# Patient Record
Sex: Male | Born: 1955 | Race: White | Hispanic: No | Marital: Married | Smoking: Former smoker
Health system: Southern US, Community
[De-identification: ages and names within clinical notes are randomized; demographics above are authoritative.]

## PROBLEM LIST (undated history)

## (undated) DIAGNOSIS — F1011 Alcohol abuse, in remission: Secondary | ICD-10-CM

## (undated) HISTORY — DX: Alcohol abuse, in remission: F10.11

---

## 2002-08-16 ENCOUNTER — Encounter: Payer: Self-pay | Admitting: Family Medicine

## 2002-08-16 ENCOUNTER — Encounter: Admission: RE | Admit: 2002-08-16 | Discharge: 2002-08-16 | Payer: Self-pay | Admitting: *Deleted

## 2006-03-08 HISTORY — PX: HERNIA REPAIR: SHX51

## 2006-06-06 ENCOUNTER — Encounter: Admission: RE | Admit: 2006-06-06 | Discharge: 2006-06-06 | Payer: Self-pay | Admitting: General Surgery

## 2006-06-08 ENCOUNTER — Ambulatory Visit (HOSPITAL_BASED_OUTPATIENT_CLINIC_OR_DEPARTMENT_OTHER): Admission: RE | Admit: 2006-06-08 | Discharge: 2006-06-08 | Payer: Self-pay | Admitting: General Surgery

## 2010-03-29 ENCOUNTER — Encounter: Payer: Self-pay | Admitting: Family Medicine

## 2012-06-09 ENCOUNTER — Encounter: Payer: Self-pay | Admitting: Internal Medicine

## 2012-06-09 ENCOUNTER — Ambulatory Visit (INDEPENDENT_AMBULATORY_CARE_PROVIDER_SITE_OTHER)
Admission: RE | Admit: 2012-06-09 | Discharge: 2012-06-09 | Disposition: A | Discharge status: Home / Self Care | Payer: BC Managed Care – PPO | Source: Ambulatory Visit | Attending: Internal Medicine | Admitting: Internal Medicine

## 2012-06-09 ENCOUNTER — Ambulatory Visit (INDEPENDENT_AMBULATORY_CARE_PROVIDER_SITE_OTHER): Payer: BC Managed Care – PPO | Admitting: Internal Medicine

## 2012-06-09 VITALS — BP 130/80 | HR 67 | Temp 97.8°F | Ht 70.0 in | Wt 174.0 lb

## 2012-06-09 DIAGNOSIS — R0609 Other forms of dyspnea: Secondary | ICD-10-CM

## 2012-06-09 DIAGNOSIS — R0602 Shortness of breath: Secondary | ICD-10-CM | POA: Insufficient documentation

## 2012-06-09 DIAGNOSIS — R0789 Other chest pain: Secondary | ICD-10-CM

## 2012-06-09 DIAGNOSIS — R0989 Other specified symptoms and signs involving the circulatory and respiratory systems: Secondary | ICD-10-CM

## 2012-06-09 LAB — CBC WITH DIFFERENTIAL/PLATELET
Basophils Absolute: 0 10*3/uL (ref 0.0–0.1)
Basophils Relative: 0.7 % (ref 0.0–3.0)
Eosinophils Relative: 1.4 % (ref 0.0–5.0)
Hemoglobin: 15.1 g/dL (ref 13.0–17.0)
Lymphocytes Relative: 21.9 % (ref 12.0–46.0)
Monocytes Relative: 7.6 % (ref 3.0–12.0)
Neutro Abs: 3.1 10*3/uL (ref 1.4–7.7)
RBC: 5.35 Mil/uL (ref 4.22–5.81)
RDW: 14.4 % (ref 11.5–14.6)
WBC: 4.5 10*3/uL (ref 4.5–10.5)

## 2012-06-09 LAB — COMPREHENSIVE METABOLIC PANEL
BUN: 12 mg/dL (ref 6–23)
CO2: 26 mEq/L (ref 19–32)
Calcium: 9.6 mg/dL (ref 8.4–10.5)
Chloride: 102 mEq/L (ref 96–112)
Creatinine, Ser: 1 mg/dL (ref 0.4–1.5)
GFR: 81.48 mL/min (ref >60.00)

## 2012-06-09 NOTE — Progress Notes (Signed)
Subjective:    Patient ID: Andres Day, male    DOB: 02-14-1956, 57 y.o.   MRN: [id number]  HPI New patient, has not seen a doctor in many years, has a number of symptoms, has a hard time describing them, to the best of my ability what he is experiencing is as follows:  When he gets anxious, gets numbness and a tingling feeling mostly in the left neck, left upper chest and left arm. Symptoms are also triggered by physical activity. He also feels dizzy. "Sometimes even the left side of my neck gets swollen " This is going on for approximately 3 years, frequently experiences palpitations when that happens but denies any syncope, diplopia or slurred speech. Symptoms decreased with rest but sometimes  "takes long time for them to go away".  Also, 2 months history of dyspnea on exertion, has been getting consistently short of breath when he go up the stairs activity that he used to do without problems. He is getting more tired recently. Despite that he remains very active in general.  Past Medical History  Diagnosis Date  . Alcohol abuse, in remission     quit 1995   Past Surgical History  Procedure Laterality Date  . Hernia repair Right 2008    inguinal   History   Social History  . Marital Status: Married    Spouse Name: N/A    Number of Children: 2  . Years of Education: N/A   Occupational History  . auction coordinator     Social History Main Topics  . Smoking status: Former Games developer  . Smokeless tobacco: Never Used     Comment: quit 1999, used to smoke 2.5 ppd  . Alcohol Use: No     Comment: former abuse   . Drug Use: No  . Sexually Active: Not on file   Other Topics Concern  . Not on file   Social History Narrative   Finished HS   Family History  Problem Relation Age of Onset  . Colon cancer Neg Hx   . Prostate cancer Neg Hx   . CAD Father 63  . Diabetes Father     many other family members  . Aneurysm Mother     type?    Review of Systems Denies  fever chills. No cough, weight loss. No claudication No nausea, vomiting, diarrhea or blood in the stools. No dysuria or difficulty urinating.     Objective:   Physical Exam BP 130/80  Pulse 67  Temp(Src) 97.8 F (36.6 C) (Oral)  Ht 5\' 10"  (1.778 m)  Wt 174 lb (78.926 kg)  BMI 24.97 kg/m2  SpO2 95%  General -- alert, well-developed, vital signs stable, BMI 24. .   Neck --no thyromegaly , normal carotid pulse, no carotid bruits HEENT -- not pale or jaundice  Lungs -- normal respiratory effort, no intercostal retractions, no accessory muscle use, and normal breath sounds.   Heart-- normal rate, regular rhythm, no murmur, and no gallop.   Abdomen--soft, non-tender, no distention, no masses, no HSM, no guarding, and no rigidity.  No bruit Extremities-- no pretibial edema bilaterally, normal femoral pulses  Neurologic-- alert & oriented X3 ; speech , gait normal; strength normal in all extremities. Psych-- Cognition and judgment appear intact. Alert and cooperative with normal attention span and concentration.  not anxious appearing and not depressed appearing.      Assessment & Plan:  Today , I spent more than 35 min with the patient, >50% of  the time counseling, and understanding his complex symptoms

## 2012-06-09 NOTE — Assessment & Plan Note (Addendum)
Patient gets some numbness in the left side of the neck, chest and left arm when he gets anxious and sometimes when he is physically active. Symptoms could be secondary to atypical presentation of angina. Cardiovascular risk factors include former tobacco abuse, father had heart attack at age 59. Lipids status unknown. Apparently no hypertension. EKG today showed sinus bradycardia. Plan: Labs Stress test Consider further eval (carotid u/s)

## 2012-06-09 NOTE — Assessment & Plan Note (Addendum)
2 months history of dyspnea on exertion with activities that he used to tolerate well, and despite that the patient remains active at work. He is a former smoker. Plan: 2 D Echo, r/o CHF Chest x-ray Labs Consider pfts

## 2012-06-09 NOTE — Patient Instructions (Addendum)
Please get your x-ray at the other [facility]  office located at: [address] [place], across from [facility].  Please go to the basement, this is a walk-in facility, they are open from 8:30 to 5:30 PM. Phone number [phone]. ----- If you have severe discomfort in the neck or chest please go to the emergency room. Will schedule echocardiogram stress test. Please come back in one month, fasting for a physical exam.

## 2012-06-12 NOTE — Addendum Note (Signed)
Addended by: Willow Ora E on: 06/12/2012 04:34 PM   Modules accepted: Orders

## 2012-06-13 ENCOUNTER — Encounter: Payer: Self-pay | Admitting: *Deleted

## 2012-06-16 ENCOUNTER — Ambulatory Visit (HOSPITAL_COMMUNITY): Payer: BC Managed Care – PPO | Attending: Cardiology

## 2012-06-16 DIAGNOSIS — R079 Chest pain, unspecified: Secondary | ICD-10-CM | POA: Insufficient documentation

## 2012-06-16 DIAGNOSIS — R0609 Other forms of dyspnea: Secondary | ICD-10-CM | POA: Insufficient documentation

## 2012-06-16 DIAGNOSIS — R072 Precordial pain: Secondary | ICD-10-CM

## 2012-06-16 DIAGNOSIS — R0989 Other specified symptoms and signs involving the circulatory and respiratory systems: Secondary | ICD-10-CM | POA: Insufficient documentation

## 2012-06-16 NOTE — Progress Notes (Signed)
Echocardiogram performed.  

## 2012-06-21 ENCOUNTER — Ambulatory Visit (HOSPITAL_COMMUNITY): Payer: BC Managed Care – PPO | Attending: Internal Medicine | Admitting: Radiology

## 2012-06-21 VITALS — BP 109/70 | Ht 70.0 in | Wt 172.0 lb

## 2012-06-21 DIAGNOSIS — R002 Palpitations: Secondary | ICD-10-CM | POA: Insufficient documentation

## 2012-06-21 DIAGNOSIS — R0609 Other forms of dyspnea: Secondary | ICD-10-CM | POA: Insufficient documentation

## 2012-06-21 DIAGNOSIS — R5381 Other malaise: Secondary | ICD-10-CM | POA: Insufficient documentation

## 2012-06-21 DIAGNOSIS — R0789 Other chest pain: Secondary | ICD-10-CM

## 2012-06-21 DIAGNOSIS — R42 Dizziness and giddiness: Secondary | ICD-10-CM | POA: Insufficient documentation

## 2012-06-21 DIAGNOSIS — Z87891 Personal history of nicotine dependence: Secondary | ICD-10-CM | POA: Insufficient documentation

## 2012-06-21 DIAGNOSIS — R079 Chest pain, unspecified: Secondary | ICD-10-CM | POA: Insufficient documentation

## 2012-06-21 DIAGNOSIS — Z8249 Family history of ischemic heart disease and other diseases of the circulatory system: Secondary | ICD-10-CM | POA: Insufficient documentation

## 2012-06-21 DIAGNOSIS — R0989 Other specified symptoms and signs involving the circulatory and respiratory systems: Secondary | ICD-10-CM | POA: Insufficient documentation

## 2012-06-21 MED ORDER — TECHNETIUM TC 99M SESTAMIBI GENERIC - CARDIOLITE
30.0000 | Freq: Once | INTRAVENOUS | Status: AC | PRN
  Administered 2012-06-21: 30 via INTRAVENOUS

## 2012-06-21 MED ORDER — TECHNETIUM TC 99M SESTAMIBI GENERIC - CARDIOLITE
10.0000 | Freq: Once | INTRAVENOUS | Status: AC | PRN
  Administered 2012-06-21: 10 via INTRAVENOUS

## 2012-06-21 NOTE — Progress Notes (Signed)
[facility] SITE 3 NUCLEAR MED [address] [place], [place] [address] [phone]    Cardiology Nuclear Med Study  Andres Day is a 57 y.o. male     MRN : [id number]     DOB: 1955-03-15  Procedure Date: 06/21/2012  Nuclear Med Background Indication for Stress Test:  Evaluation for Ischemia History:  06/16/2012:  ECHO No prior Cardiac Hx Cardiac Risk Factors: Family History - CAD and History of Smoking  Symptoms:  Chest Pain, Dizziness, DOE, Fatigue, Fatigue with Exertion, Light-Headedness and Palpitations   Nuclear Pre-Procedure Caffeine/Decaff Intake:  None > 12 hrs NPO After: 9:00pm   Lungs:  clear O2 Sat: 97% on room air. IV 0.9% NS with Angio Cath:  20g  IV Site: R Antecubital x 1, tolerated well IV Started by:  Irean Hong, RN  Chest Size (in):  42 Cup Size: n/a  Height: 5\' 10"  (1.778 m)  Weight:  172 lb (78.019 kg)  BMI:  Body mass index is 24.68 kg/(m^2). Tech Comments:  n/a    Nuclear Med Study 1 or 2 day study: 1 day  Stress Test Type:  Stress  Reading MD: Kristeen Miss, MD  Order Authorizing Provider:  Willow Ora, MD  Resting Radionuclide: Technetium 73m Sestamibi  Resting Radionuclide Dose: 9.4 mCi   Stress Radionuclide:  Technetium 93m Sestamibi  Stress Radionuclide Dose: 30.1 mCi           Stress Protocol Rest HR: 57 Stress HR: 151  Rest BP: 109/70 Stress BP: 173/69  Exercise Time (min): 10:00 METS: 11.70   Predicted Max HR: 162 bpm % Max HR: 93.21 bpm Rate Pressure Product: 52841   Dose of Adenosine (mg):  n/a Dose of Lexiscan: n/a mg  Dose of Atropine (mg): n/a Dose of Dobutamine: n/a mcg/kg/min (at max HR)  Stress Test Technologist: Milana Na, EMT-P  Nuclear Technologist:  Domenic Polite, CNMT     Rest Procedure:  Myocardial perfusion imaging was performed at rest 45 minutes following the intravenous administration of Technetium 36m Sestamibi. Rest ECG: NSR - Normal EKG  Stress Procedure:  The patient exercised on the  treadmill utilizing the Bruce Protocol for 10:00 minutes. The patient stopped due to (L) arm/ (L) neck throbbing, and denied any chest pain.  Technetium 12m Sestamibi was injected at peak exercise and myocardial perfusion imaging was performed after a brief delay. Stress ECG: No significant change from baseline ECG  QPS Raw Data Images:  Mild diaphragmatic attenuation.  Normal left ventricular size. Stress Images:   There is a small area of mildly decreased uptake in the inferior basal segments.   Rest Images:   There is a small area of mildly decreased uptake in the inferior basal segments. Subtraction (SDS):  No evidence of ischemia. Transient Ischemic Dilatation (Normal <1.22):  0.92 Lung/Heart Ratio (Normal <0.45):  0.29  Quantitative Gated Spect Images QGS EDV:  99 ml QGS ESV:  40 ml  Impression Exercise Capacity:  Good exercise capacity. BP Response:  Normal blood pressure response. Clinical Symptoms:  No significant symptoms noted. ECG Impression:  No significant ST segment change suggestive of ischemia. Comparison with Prior Nuclear Study: No images to compare  Overall Impression:  Low risk stress nuclear study.  No evidence of ischemia.  The inferior basal attenuation is likely due to diaphragmatic attenuation.   LV Ejection Fraction: 60%.  LV Wall Motion:  NL LV Function; NL Wall Motion.   Vesta Mixer, Montez Hageman., MD, [facility] 06/21/2012, 5:49 PM Office -  [phone] Pager [phone]

## 2012-07-10 ENCOUNTER — Ambulatory Visit (INDEPENDENT_AMBULATORY_CARE_PROVIDER_SITE_OTHER): Payer: BC Managed Care – PPO | Admitting: Internal Medicine

## 2012-07-10 ENCOUNTER — Encounter: Payer: Self-pay | Admitting: Internal Medicine

## 2012-07-10 VITALS — BP 122/74 | HR 72 | Wt 174.0 lb

## 2012-07-10 DIAGNOSIS — Z0000 Encounter for general adult medical examination without abnormal findings: Secondary | ICD-10-CM | POA: Insufficient documentation

## 2012-07-10 DIAGNOSIS — R0609 Other forms of dyspnea: Secondary | ICD-10-CM

## 2012-07-10 DIAGNOSIS — J309 Allergic rhinitis, unspecified: Secondary | ICD-10-CM

## 2012-07-10 DIAGNOSIS — R0789 Other chest pain: Secondary | ICD-10-CM

## 2012-07-10 LAB — LIPID PANEL
HDL: 53.8 mg/dL (ref >39.00)
Total CHOL/HDL Ratio: 4
VLDL: 9.2 mg/dL (ref 0.0–40.0)

## 2012-07-10 MED ORDER — FLUTICASONE PROPIONATE 50 MCG/ACT NA SUSP: 2.0000 | Freq: Every day | NASAL | Status: DC

## 2012-07-10 NOTE — Assessment & Plan Note (Signed)
Chest x-ray, echocardiogram, stress this were negative. Symptoms are subjectively decreased. Plan is observation

## 2012-07-10 NOTE — Progress Notes (Signed)
  Subjective:    Patient ID: Andres Day, male    DOB: [birth year], 57 y.o.   MRN: [id number]  HPI F/U from previous visit  since the last time he was here, CP-DOE workup was negative. Results reviewed with patient.  Past Medical History  Diagnosis Date  . Alcohol abuse, in remission     quit 1995   Past Surgical History  Procedure Laterality Date  . Hernia repair Right 2008    inguinal     Review of Systems Still has some occasional discomfort at the left neck as previously described and mild DOE however subjectively symptoms are less noticeable. Denies wheezing or coughing with exertion. Today did reports a chronic nasal congestion and postnasal dripping year round. Admits to some stress but not  anxiety perse.    Objective:   Physical Exam General -- alert, well-developed,No apparent distress    HEENT --  face symmetric and nose is slightly congested  Lungs -- normal respiratory effort, no intercostal retractions, no accessory muscle use, and normal breath sounds.   Heart-- normal rate, regular rhythm, no murmur, and no gallop.    Neurologic-- alert & oriented X3 and strength normal in all extremities. Psych-- Cognition and judgment appear intact. Alert and cooperative with normal attention span and concentration.  not anxious appearing and not depressed appearing.       Assessment & Plan:

## 2012-07-10 NOTE — Assessment & Plan Note (Signed)
Although we are not doing any physical exam today, we talked about colon cancer screening.He's not ready for a colonoscopy agreed to do a iFOB. Also will check a cholesterol panel.

## 2012-07-10 NOTE — Assessment & Plan Note (Signed)
Year round symptoms, rx flonase

## 2012-07-10 NOTE — Assessment & Plan Note (Signed)
Denies cough or wheezing with exertion, echocardiogram essentially normal. Chest x-ray normal. Deconditioning? Recommend a gradual exercise program

## 2012-07-10 NOTE — Patient Instructions (Addendum)
Start a exercise program (heart rate ~ 130), gradually try to exercise 30 minutes a day Next visit 6-8  months, fasting for a physical

## 2013-01-11 ENCOUNTER — Other Ambulatory Visit: Payer: Self-pay

## 2019-03-22 DIAGNOSIS — H5203 Hypermetropia, bilateral: Secondary | ICD-10-CM | POA: Diagnosis not present

## 2019-03-22 DIAGNOSIS — H2513 Age-related nuclear cataract, bilateral: Secondary | ICD-10-CM | POA: Diagnosis not present

## 2021-04-08 DIAGNOSIS — H2513 Age-related nuclear cataract, bilateral: Secondary | ICD-10-CM | POA: Diagnosis not present

## 2021-04-08 DIAGNOSIS — H5203 Hypermetropia, bilateral: Secondary | ICD-10-CM | POA: Diagnosis not present

## 2021-04-15 ENCOUNTER — Other Ambulatory Visit: Payer: Self-pay

## 2021-04-15 ENCOUNTER — Ambulatory Visit (INDEPENDENT_AMBULATORY_CARE_PROVIDER_SITE_OTHER): Payer: Medicare Other | Admitting: Nurse Practitioner

## 2021-04-15 ENCOUNTER — Encounter: Payer: Self-pay | Admitting: Nurse Practitioner

## 2021-04-15 VITALS — BP 163/65 | HR 64 | Temp 98.0°F | Ht 70.0 in | Wt 195.0 lb

## 2021-04-15 DIAGNOSIS — L408 Other psoriasis: Secondary | ICD-10-CM | POA: Insufficient documentation

## 2021-04-15 DIAGNOSIS — I6529 Occlusion and stenosis of unspecified carotid artery: Secondary | ICD-10-CM | POA: Insufficient documentation

## 2021-04-15 DIAGNOSIS — Z6825 Body mass index (BMI) 25.0-25.9, adult: Secondary | ICD-10-CM | POA: Insufficient documentation

## 2021-04-15 DIAGNOSIS — Z6827 Body mass index (BMI) 27.0-27.9, adult: Secondary | ICD-10-CM

## 2021-04-15 DIAGNOSIS — R0789 Other chest pain: Secondary | ICD-10-CM

## 2021-04-15 DIAGNOSIS — Z7689 Persons encountering health services in other specified circumstances: Secondary | ICD-10-CM

## 2021-04-15 DIAGNOSIS — I779 Disorder of arteries and arterioles, unspecified: Secondary | ICD-10-CM | POA: Insufficient documentation

## 2021-04-15 DIAGNOSIS — R0602 Shortness of breath: Secondary | ICD-10-CM | POA: Diagnosis not present

## 2021-04-15 DIAGNOSIS — Z87891 Personal history of nicotine dependence: Secondary | ICD-10-CM | POA: Insufficient documentation

## 2021-04-15 DIAGNOSIS — I10 Essential (primary) hypertension: Secondary | ICD-10-CM

## 2021-04-15 MED ORDER — TRIAMCINOLONE ACETONIDE 0.025 % EX CREA: 1.0000 "application " | TOPICAL_CREAM | Freq: Two times a day (BID) | CUTANEOUS | 2 refills | Status: AC

## 2021-04-15 MED ORDER — LISINOPRIL 5 MG PO TABS: 5.0000 mg | ORAL_TABLET | Freq: Every day | ORAL | 3 refills | Status: DC

## 2021-04-15 NOTE — Progress Notes (Signed)
New Patient Office Visit  Subjective:  Patient ID: Andres Day, male    DOB: Dec 27, 1955  Age: 66 y.o. MRN: [id number]  CC:  Chief Complaint  Patient presents with   New Patient (Initial Visit)    HPI MUADH CREASY presents to establish new primary care provider. States that he has not had a primary provider, or any provider, for several years. He states that he has shortness of breath. This is much worse with exertion. He states that he starts feeling short of breath with chest pain after 100 yards. He states that if he keeps going, it does get worse. Pain will radiate down the left arm and into the left side of the neck. If he slows down and rests, this immediately improves.  States that sometimes, at night, while laying down, he feels some swelling in his left chest. He did have some similar complaints the last time he saw a provider, which was 2014. He did have ECG done in 2014. This sowed sinus bradycardia and was otherwise normal. His echo showed mild LVH, and chest x-ray was normal.  -has numbness in the hands, mostly in the thumb and first two fingers. Does a lot of Dealer work. Uses his hands often. Believes he may have some arthritis.  -chronic psoriasis    Past Medical History:  Diagnosis Date   Alcohol abuse, in remission    quit 1995    Past Surgical History:  Procedure Laterality Date   HERNIA REPAIR Right 2008   inguinal    Family History  Problem Relation Age of Onset   Colon cancer Neg Hx    Prostate cancer Neg Hx    CAD Father 84   Diabetes Father        many other family members   Aneurysm Mother        type?    Social History   Socioeconomic History   Marital status: Married    Spouse name: Not on file   Number of children: 2   Years of education: Not on file   Highest education level: Not on file  Occupational History   Occupation: Chief Executive Officer   Tobacco Use   Smoking status: Former   Smokeless tobacco: Never   Tobacco  comments:    quit 1999, used to smoke 2.5 ppd  Substance and Sexual Activity   Alcohol use: No    Comment: former abuse    Drug use: No   Sexual activity: Not Currently  Other Topics Concern   Not on file  Social History Narrative   Finished HS   Social Determinants of Health   Financial Resource Strain: Not on file  Food Insecurity: Not on file  Transportation Needs: Not on file  Physical Activity: Not on file  Stress: Not on file  Social Connections: Not on file  Intimate Partner Violence: Not on file    ROS Review of Systems  Constitutional:  Positive for fatigue. Negative for activity change, chills and fever.  HENT:  Negative for congestion, postnasal drip, rhinorrhea, sinus pressure, sinus pain, sneezing and sore throat.   Eyes: Negative.   Respiratory:  Positive for chest tightness, shortness of breath and wheezing. Negative for cough.   Cardiovascular:  Positive for chest pain. Negative for palpitations.  Gastrointestinal:  Negative for constipation, diarrhea, nausea and vomiting.  Endocrine: Negative for cold intolerance, heat intolerance, polydipsia and polyuria.  Genitourinary:  Negative for dysuria, frequency and urgency.  Musculoskeletal:  Positive for arthralgias  and joint swelling. Negative for back pain and myalgias.       Pain and swelling in the joints of the fingers and hands. Has tingling in the hands and fingers. This is worse on the right side than left.   Skin:  Negative for rash.  Allergic/Immunologic: Negative for environmental allergies.  Neurological:  Negative for dizziness, weakness and headaches.  Psychiatric/Behavioral:  The patient is not nervous/anxious.    Objective:   Today's Vitals   04/15/21 0957  BP: (!) 163/65  Pulse: 64  Temp: 98 F (36.7 C)  SpO2: 98%  Weight: 195 lb 0.4 oz (88.5 kg)  Height: 5\' 10"  (1.778 m)   Body mass index is 27.98 kg/m.   Physical Exam Vitals and nursing note reviewed.  Constitutional:       Appearance: Normal appearance. He is well-developed.  HENT:     Head: Normocephalic and atraumatic.  Eyes:     Pupils: Pupils are equal, round, and reactive to light.  Neck:     Vascular: No carotid bruit.  Cardiovascular:     Rate and Rhythm: Normal rate and regular rhythm.     Pulses: Normal pulses.     Heart sounds: Normal heart sounds.     Comments: ECG done today is within normal limits  Pulmonary:     Effort: Pulmonary effort is normal.     Breath sounds: Normal breath sounds.  Abdominal:     Palpations: Abdomen is soft.  Musculoskeletal:        General: Normal range of motion.     Cervical back: Normal range of motion and neck supple.  Lymphadenopathy:     Cervical: No cervical adenopathy.  Skin:    General: Skin is warm and dry.     Capillary Refill: Capillary refill takes less than 2 seconds.  Neurological:     General: No focal deficit present.     Mental Status: He is alert and oriented to person, place, and time.  Psychiatric:        Mood and Affect: Mood normal.        Behavior: Behavior normal.        Thought Content: Thought content normal.        Judgment: Judgment normal.    Assessment & Plan:  1. Encounter to establish care Appointment today to establish new primary care provider    2. Atypical chest pain ECG is normal during today's visit.   3. Shortness of breath Normal ECG today. Will get CT chest, lung cancer screening for further evaluation. He does have history of smoking for 29 years. Has quit since [place] LOW DOSE WO CONTRAST; Future - EKG 12-Lead  4. Essential hypertension Start lisinopril 5mg  daily. Recommended decreasing salt and increasing water in the diet. Patient is physically active.  - lisinopril (ZESTRIL) 5 MG tablet; Take 1 tablet (5 mg total) by mouth daily.  Dispense: 90 tablet; Refill: 3  5. Stenosis of carotid artery, unspecified laterality Will get carotid doppler study for further  evaluation - US Carotid Bilateral; Future  6. History of smoking 25-50 pack years Patient was heavy smoker for 29 years. Quit smoking in [place]; Future  7. Body mass index 27.0-27.9, adult Encourage patient to limit calorie intake to 2000 cal/day or less.  He should consume a low cholesterol, low-fat diet.    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 Apply to effected areas twice daily as needed  - triamcinolone (KENALOG) 0.025 % cream; Apply 1 application topically 2 (two) times daily.  Dispense: 80 g; Refill: 2    Problem List Items Addressed This Visit       Cardiovascular and Mediastinum   Essential hypertension   Relevant Medications   lisinopril (ZESTRIL) 5 MG tablet   Stenosis of carotid artery   Relevant Medications   lisinopril (ZESTRIL) 5 MG tablet   Other Relevant Orders   US Carotid Bilateral     Musculoskeletal and Integument   Psoriasis annularis   Relevant Medications   triamcinolone (KENALOG) 0.025 % cream     Other   Shortness of breath   Relevant Orders   CT CHEST LUNG CANCER SCREENING LOW DOSE WO CONTRAST   EKG 12-Lead (Completed)   Atypical chest pain   History of smoking 25-50 pack years   Relevant Orders   CT CHEST LUNG CANCER SCREENING LOW DOSE WO CONTRAST   Body mass index 27.0-27.9, adult   Other Visit Diagnoses     Encounter to establish care    -  Primary       Outpatient Encounter Medications as of 04/15/2021  Medication Sig   lisinopril (ZESTRIL) 5 MG tablet Take 1 tablet (5 mg total) by mouth daily.   triamcinolone (KENALOG) 0.025 % cream Apply 1 application topically 2 (two) times daily.   aspirin 81 MG tablet Take 81 mg by mouth daily.   fluticasone (FLONASE) 50 MCG/ACT nasal spray Place 2 sprays into the nose daily.   No facility-administered encounter medications on file as of 04/15/2021.    Follow-up: Return in about 2 weeks (around 04/29/2021)  for medicare wellness, FBW a week prior to visit. add psa, CK, CKMB, Free T4.   Ronnell Freshwater, NP

## 2021-04-21 ENCOUNTER — Ambulatory Visit
Admission: RE | Admit: 2021-04-21 | Discharge: 2021-04-21 | Disposition: A | Discharge status: Home / Self Care | Payer: Medicare Other | Source: Ambulatory Visit | Attending: Nurse Practitioner | Admitting: Nurse Practitioner

## 2021-04-21 ENCOUNTER — Other Ambulatory Visit: Payer: Self-pay

## 2021-04-21 DIAGNOSIS — E039 Hypothyroidism, unspecified: Secondary | ICD-10-CM

## 2021-04-21 DIAGNOSIS — Z1321 Encounter for screening for nutritional disorder: Secondary | ICD-10-CM

## 2021-04-21 DIAGNOSIS — I6529 Occlusion and stenosis of unspecified carotid artery: Secondary | ICD-10-CM

## 2021-04-21 DIAGNOSIS — Z130 Encounter for screening for diseases of the blood and blood-forming organs and certain disorders involving the immune mechanism: Secondary | ICD-10-CM

## 2021-04-21 DIAGNOSIS — I6523 Occlusion and stenosis of bilateral carotid arteries: Secondary | ICD-10-CM | POA: Diagnosis not present

## 2021-04-21 DIAGNOSIS — Z0000 Encounter for general adult medical examination without abnormal findings: Secondary | ICD-10-CM

## 2021-04-21 DIAGNOSIS — Z125 Encounter for screening for malignant neoplasm of prostate: Secondary | ICD-10-CM

## 2021-04-21 DIAGNOSIS — I779 Disorder of arteries and arterioles, unspecified: Secondary | ICD-10-CM

## 2021-04-21 NOTE — Progress Notes (Signed)
Plaque present with no significant stenosis. Review with patient at visit 04/30/2021

## 2021-04-23 ENCOUNTER — Other Ambulatory Visit: Payer: Medicare Other

## 2021-04-23 ENCOUNTER — Other Ambulatory Visit: Payer: Self-pay

## 2021-04-23 DIAGNOSIS — Z125 Encounter for screening for malignant neoplasm of prostate: Secondary | ICD-10-CM

## 2021-04-23 DIAGNOSIS — Z0000 Encounter for general adult medical examination without abnormal findings: Secondary | ICD-10-CM

## 2021-04-23 DIAGNOSIS — I779 Disorder of arteries and arterioles, unspecified: Secondary | ICD-10-CM

## 2021-04-23 DIAGNOSIS — Z13228 Encounter for screening for other metabolic disorders: Secondary | ICD-10-CM

## 2021-04-23 DIAGNOSIS — Z1321 Encounter for screening for nutritional disorder: Secondary | ICD-10-CM | POA: Diagnosis not present

## 2021-04-23 DIAGNOSIS — E039 Hypothyroidism, unspecified: Secondary | ICD-10-CM

## 2021-04-23 DIAGNOSIS — Z1329 Encounter for screening for other suspected endocrine disorder: Secondary | ICD-10-CM | POA: Diagnosis not present

## 2021-04-24 LAB — CBC
Hematocrit: 45.3 % (ref 37.5–51.0)
Hemoglobin: 15.2 g/dL (ref 13.0–17.7)
MCH: 28.4 pg (ref 26.6–33.0)
MCHC: 33.6 g/dL (ref 31.5–35.7)
MCV: 85 fL (ref 79–97)
Platelets: 233 10*3/uL (ref 150–450)
RBC: 5.35 x10E6/uL (ref 4.14–5.80)
RDW: 13.5 % (ref 11.6–15.4)
WBC: 5.6 10*3/uL (ref 3.4–10.8)

## 2021-04-24 LAB — COMPREHENSIVE METABOLIC PANEL
ALT: 10 IU/L (ref 0–44)
AST: 16 IU/L (ref 0–40)
Albumin/Globulin Ratio: 1.6 (ref 1.2–2.2)
Albumin: 4.4 g/dL (ref 3.8–4.8)
Alkaline Phosphatase: 79 IU/L (ref 44–121)
BUN/Creatinine Ratio: 12 (ref 10–24)
BUN: 15 mg/dL (ref 8–27)
Bilirubin Total: 0.6 mg/dL (ref 0.0–1.2)
CO2: 24 mmol/L (ref 20–29)
Calcium: 9.5 mg/dL (ref 8.6–10.2)
Chloride: 103 mmol/L (ref 96–106)
Creatinine, Ser: 1.25 mg/dL (ref 0.76–1.27)
Globulin, Total: 2.7 g/dL (ref 1.5–4.5)
Glucose: 102 mg/dL — ABNORMAL HIGH (ref 70–99)
Potassium: 4.8 mmol/L (ref 3.5–5.2)
Sodium: 141 mmol/L (ref 134–144)
Total Protein: 7.1 g/dL (ref 6.0–8.5)
eGFR: 63 mL/min/{1.73_m2} (ref >59)

## 2021-04-24 LAB — T4, FREE: Free T4: 0.93 ng/dL (ref 0.82–1.77)

## 2021-04-24 LAB — HEMOGLOBIN A1C
Est. average glucose Bld gHb Est-mCnc: 126 mg/dL
Hgb A1c MFr Bld: 6 % — ABNORMAL HIGH (ref 4.8–5.6)

## 2021-04-24 LAB — CK TOTAL AND CKMB (NOT AT ARMC)
CK-MB Index: 1.7 ng/mL (ref 0.0–10.4)
Total CK: 112 U/L (ref 41–331)

## 2021-04-24 LAB — LIPID PANEL
Chol/HDL Ratio: 3.8 ratio (ref 0.0–5.0)
Cholesterol, Total: 193 mg/dL (ref 100–199)
HDL: 51 mg/dL (ref >39)
LDL Chol Calc (NIH): 128 mg/dL — ABNORMAL HIGH (ref 0–99)
Triglycerides: 75 mg/dL (ref 0–149)
VLDL Cholesterol Cal: 14 mg/dL (ref 5–40)

## 2021-04-24 LAB — TSH: TSH: 5.7 u[IU]/mL — ABNORMAL HIGH (ref 0.450–4.500)

## 2021-04-24 LAB — PSA: Prostate Specific Ag, Serum: 4 ng/mL (ref 0.0–4.0)

## 2021-04-24 NOTE — Progress Notes (Signed)
Consider additions of levothyroxine 40mcg daily. Discuss at visit 04/30/2021

## 2021-04-30 ENCOUNTER — Other Ambulatory Visit: Payer: Self-pay

## 2021-04-30 ENCOUNTER — Ambulatory Visit (INDEPENDENT_AMBULATORY_CARE_PROVIDER_SITE_OTHER): Payer: Medicare Other | Admitting: Nurse Practitioner

## 2021-04-30 ENCOUNTER — Encounter: Payer: Self-pay | Admitting: Nurse Practitioner

## 2021-04-30 VITALS — BP 114/66 | HR 64 | Temp 97.8°F | Ht 70.0 in | Wt 192.8 lb

## 2021-04-30 DIAGNOSIS — R0602 Shortness of breath: Secondary | ICD-10-CM

## 2021-04-30 DIAGNOSIS — D485 Neoplasm of uncertain behavior of skin: Secondary | ICD-10-CM | POA: Diagnosis not present

## 2021-04-30 DIAGNOSIS — I779 Disorder of arteries and arterioles, unspecified: Secondary | ICD-10-CM

## 2021-04-30 DIAGNOSIS — Z0000 Encounter for general adult medical examination without abnormal findings: Secondary | ICD-10-CM

## 2021-04-30 DIAGNOSIS — I10 Essential (primary) hypertension: Secondary | ICD-10-CM

## 2021-04-30 NOTE — Progress Notes (Signed)
Subjective:   Andres Day is a 66 y.o. male who presents for Medicare Annual/Subsequent preventive examination.  -improved chest discomfort and shortness of breath. Normal ck and CKMB on labs.  -better control of blood pressure -mild elevation of LDL -review of carotid doppler - mild plaque formation in bilateral carotids. No significant stenosis present -several dark and rough colored moles on his back which are bothersome. Would like to have referral to dermatology for further evaluation.   Review of Systems    Review of Systems  Constitutional:  Negative for fever, malaise/fatigue and weight loss.  HENT:  Negative for congestion, ear discharge, ear pain, hearing loss and sore throat.   Eyes: Negative.   Respiratory:  Negative for cough, shortness of breath and wheezing.   Cardiovascular:  Positive for orthopnea. Negative for chest pain.  Gastrointestinal:  Negative for abdominal pain, blood in stool, constipation, diarrhea, nausea and vomiting.  Genitourinary:  Negative for dysuria, flank pain, frequency and urgency.  Musculoskeletal:  Negative for back pain and myalgias.  Skin:  Negative for itching and rash.       Few moles on upper back he would like to have evaluated per dermatology   Neurological:  Negative for dizziness, weakness and headaches.  Psychiatric/Behavioral:  Negative for depression.          Objective:    Today's Vitals   04/30/21 1052  BP: 114/66  Pulse: 64  Temp: 97.8 F (36.6 C)  SpO2: 98%  Weight: 192 lb 12.8 oz (87.5 kg)  Height: 5\' 10"  (1.778 m)   Body mass index is 27.66 kg/m.  Physical Exam Vitals and nursing note reviewed.  Constitutional:      Appearance: Normal appearance. He is well-developed.  HENT:     Head: Normocephalic and atraumatic.     Right Ear: Tympanic membrane, ear canal and external ear normal.     Left Ear: Tympanic membrane, ear canal and external ear normal.     Nose: Nose normal.     Mouth/Throat:     Mouth:  Mucous membranes are moist.     Pharynx: Oropharynx is clear.  Eyes:     Extraocular Movements: Extraocular movements intact.     Conjunctiva/sclera: Conjunctivae normal.     Pupils: Pupils are equal, round, and reactive to light.  Neck:     Vascular: No carotid bruit.  Cardiovascular:     Rate and Rhythm: Normal rate and regular rhythm.     Pulses: Normal pulses.     Heart sounds: Normal heart sounds.  Pulmonary:     Effort: Pulmonary effort is normal.     Breath sounds: Normal breath sounds.  Abdominal:     General: Bowel sounds are normal. There is no distension.     Palpations: Abdomen is soft. There is no mass.     Tenderness: There is no abdominal tenderness. There is no guarding or rebound.     Hernia: No hernia is present.  Musculoskeletal:        General: Normal range of motion.     Cervical back: Normal range of motion and neck supple.  Lymphadenopathy:     Cervical: No cervical adenopathy.  Skin:    General: Skin is warm and dry.     Capillary Refill: Capillary refill takes less than 2 seconds.     Comments: There are several dark brown mole on his upper back. They measure about 90mm in diameter. They are rough and slightly raised from surrounding skin.  Neurological:     General: No focal deficit present.     Mental Status: He is alert and oriented to person, place, and time.  Psychiatric:        Mood and Affect: Mood normal.        Behavior: Behavior normal.        Thought Content: Thought content normal.        Judgment: Judgment normal.      Current Medications (verified) Outpatient Encounter Medications as of 04/30/2021  Medication Sig   aspirin 81 MG tablet Take 81 mg by mouth daily.   fluticasone (FLONASE) 50 MCG/ACT nasal spray Place 2 sprays into the nose daily.   lisinopril (ZESTRIL) 5 MG tablet Take 1 tablet (5 mg total) by mouth daily.   triamcinolone (KENALOG) 0.025 % cream Apply 1 application topically 2 (two) times daily.   No  facility-administered encounter medications on file as of 04/30/2021.    Allergies (verified) Patient has no known allergies.   History: Past Medical History:  Diagnosis Date   Alcohol abuse, in remission    quit 1995   Past Surgical History:  Procedure Laterality Date   HERNIA REPAIR Right 2008   inguinal   Family History  Problem Relation Age of Onset   Colon cancer Neg Hx    Prostate cancer Neg Hx    CAD Father 28   Diabetes Father        many other family members   Aneurysm Mother        type?   Social History   Socioeconomic History   Marital status: Married    Spouse name: Not on file   Number of children: 2   Years of education: Not on file   Highest education level: Not on file  Occupational History   Occupation: Chief Executive Officer   Tobacco Use   Smoking status: Former   Smokeless tobacco: Never   Tobacco comments:    quit 1999, used to smoke 2.5 ppd  Substance and Sexual Activity   Alcohol use: No    Comment: former abuse    Drug use: No   Sexual activity: Not Currently  Other Topics Concern   Not on file  Social History Narrative   Finished HS   Social Determinants of Health   Financial Resource Strain: Not on file  Food Insecurity: Not on file  Transportation Needs: Not on file  Physical Activity: Not on file  Stress: Not on file  Social Connections: Not on file    Tobacco Counseling Counseling given: Not Answered Tobacco comments: quit 1999, used to smoke 2.5 ppd  Diabetic?no   Activities of Daily Living In your present state of health, do you have any difficulty performing the following activities: 04/30/2021 04/15/2021  Hearing? N N  Vision? N N  Difficulty concentrating or making decisions? N N  Walking or climbing stairs? N N  Dressing or bathing? N N  Doing errands, shopping? N N  Some recent data might be hidden    Patient Care Team: Ronnell Freshwater, NP as PCP - General (Family Medicine)      Assessment:  1.  Encounter for Medicare annual wellness exam Annual medicare wellness visit today   2. Essential hypertension Stable. Continue lisinopril 5mg  daily   3. Carotid artery disease, unspecified laterality, unspecified type ([place]) Reviewed carotid doppler ultrasound. Patient does have some plaque in both carotids without significant stenosis. Will continue yearly monitoring.  Patient declines statin therapy at this time.  4. Shortness of breath Improved. Will monitor   5. 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 Will refer to dermatology for further evaluation.  - Ambulatory referral to Dermatology   Hearing/Vision screen No results found.   Depression Screen PHQ 2/9 Scores 04/30/2021 04/15/2021  PHQ - 2 Score 0 0  PHQ- 9 Score 1 1    Fall Risk Fall Risk  04/30/2021 04/15/2021  Falls in the past year? 0 0  Number falls in past yr: 0 0  Injury with Fall? 0 0  Follow up Falls evaluation completed Falls evaluation completed    [place]:  Any stairs in or around the home? Yes  If so, are there any without handrails? No  Home free of loose throw rugs in walkways, pet beds, electrical cords, etc? Yes  Adequate lighting in your home to reduce risk of falls? Yes   ASSISTIVE DEVICES UTILIZED TO PREVENT FALLS:  Life alert? No  Use of a cane, walker or w/c? No  Grab bars in the bathroom? No  Shower chair or bench in shower? No  Elevated toilet seat or a handicapped toilet? No   TIMED UP AND GO:  Was the test performed? Yes .  Length of time to ambulate 10 feet: 10 sec.   Gait steady and fast without use of assistive device  Cognitive Function:     6CIT Screen 04/30/2021  What Year? 0 points  What month? 0 points  What time? 0 points  Count back from 20 0 points  Months in reverse 0 points  Repeat phrase 0 points  Total Score 0    Immunizations Immunization History  Administered Date(s) Administered    Influenza-Unspecified 12/15/2018, 12/21/2019, 12/03/2020   PFIZER(Purple Top)SARS-COV-2 Vaccination 04/03/2019, 04/18/2019, 12/21/2019, 12/03/2020    TDAP status: Due, Education has been provided regarding the importance of this vaccine. Advised may receive this vaccine at local pharmacy or Health Dept. Aware to provide a copy of the vaccination record if obtained from local pharmacy or Health Dept. Verbalized acceptance and understanding.  Flu Vaccine status: Declined, Education has been provided regarding the importance of this vaccine but patient still declined. Advised may receive this vaccine at local pharmacy or Health Dept. Aware to provide a copy of the vaccination record if obtained from local pharmacy or Health Dept. Verbalized acceptance and understanding.  Pneumococcal vaccine status: Declined,  Education has been provided regarding the importance of this vaccine but patient still declined. Advised may receive this vaccine at local pharmacy or Health Dept. Aware to provide a copy of the vaccination record if obtained from local pharmacy or Health Dept. Verbalized acceptance and understanding.   Covid-19 vaccine status: Completed vaccines  Qualifies for Shingles Vaccine? Yes   Zostavax completed No   Shingrix Completed?: No.    Education has been provided regarding the importance of this vaccine. Patient has been advised to call insurance company to determine out of pocket expense if they have not yet received this vaccine. Advised may also receive vaccine at local pharmacy or Health Dept. Verbalized acceptance and understanding.  Screening Tests Health Maintenance  Topic Date Due   COVID-19 Vaccine (5 - Booster) 01/28/2021   Zoster Vaccines- Shingrix (1 of 2) 07/28/2021 (Originally 02/08/2004)   Pneumonia Vaccine 66+ Years old (1 - PCV) 04/30/2022 (Originally 02/08/2019)   COLONOSCOPY (Pts 45-21yrs Insurance coverage will need to be confirmed)  04/30/2022 (Originally 02/08/1999)    TETANUS/TDAP  04/30/2022 (Originally 02/07/1973)   Hepatitis C Screening  04/30/2022 (Originally 02/08/1972)   INFLUENZA VACCINE  Completed   HPV VACCINES  Aged Out    Health Maintenance  Health Maintenance Due  Topic Date Due   COVID-19 Vaccine (5 - Booster) 01/28/2021   Colon Cancer Screening - patient declines.   Lung Cancer Screening: (Low Dose CT Chest recommended if Age 44-80 years, 30 pack-year currently smoking OR have quit w/in 15years.) does not qualify.   Lung Cancer Screening Referral: no  Additional Screening:  Hepatitis C Screening: does qualify; Completed no  Vision Screening: Recommended annual ophthalmology exams for early detection of glaucoma and other disorders of the eye. Is the patient up to date with their annual eye exam?  Yes  Who is the provider or what is the name of the office in which the patient attends annual eye exams? Dr Delman Cheadle If pt is not established with a provider, would they like to be referred to a provider to establish care? No .   Dental Screening: Recommended annual dental exams for proper oral hygiene  Community Resource Referral / Chronic Care Management: CRR required this visit?  No   CCM required this visit?  No      Plan:     I have personally reviewed and noted the following in the patients chart:   Medical and social history Use of alcohol, tobacco or illicit drugs  Current medications and supplements including opioid prescriptions. Patient is not currently taking opioid prescriptions. Functional ability and status Nutritional status Physical activity Advanced directives List of other physicians Hospitalizations, surgeries, and ER visits in previous 12 months Vitals Screenings to include cognitive, depression, and falls Referrals and appointments  In addition, I have reviewed and discussed with patient certain preventive protocols, quality metrics, and best practice recommendations. A written personalized care plan  for preventive services as well as general preventive health recommendations were provided to patient.     Leretha Pol, FNP-c    04/30/2021

## 2021-10-27 NOTE — Progress Notes (Unsigned)
Established patient visit   Patient: Andres Day   DOB: [birth year]   66 y.o. Male  MRN: [id number] Visit Date: 10/28/2021   No chief complaint on file.  Subjective    HPI  Follow up visit -blood pressure - typically well controlled.  -will need new referral  to dermatology     Medications: Outpatient Medications Prior to Visit  Medication Sig   aspirin 81 MG tablet Take 81 mg by mouth daily.   fluticasone (FLONASE) 50 MCG/ACT nasal spray Place 2 sprays into the nose daily.   lisinopril (ZESTRIL) 5 MG tablet Take 1 tablet (5 mg total) by mouth daily.   triamcinolone (KENALOG) 0.025 % cream Apply 1 application topically 2 (two) times daily.   No facility-administered medications prior to visit.    Review of Systems  {Labs (Optional):23779}   Objective    There were no vitals taken for this visit. BP Readings from Last 3 Encounters:  04/30/21 114/66  04/15/21 (!) 163/65  07/10/12 122/74    Wt Readings from Last 3 Encounters:  04/30/21 192 lb 12.8 oz (87.5 kg)  04/15/21 195 lb 0.4 oz (88.5 kg)  07/10/12 174 lb (78.9 kg)    Physical Exam  ***  No results found for any visits on 10/28/21.  Assessment & Plan     Problem List Items Addressed This Visit   None    No follow-ups on file.         Ronnell Freshwater, NP  [facility] at [facility] [phone] (phone) [phone] (fax)  [place]

## 2021-10-28 ENCOUNTER — Encounter: Payer: Self-pay | Admitting: Nurse Practitioner

## 2021-10-28 ENCOUNTER — Ambulatory Visit (INDEPENDENT_AMBULATORY_CARE_PROVIDER_SITE_OTHER): Payer: Medicare Other | Admitting: Nurse Practitioner

## 2021-10-28 VITALS — BP 110/60 | HR 55 | Ht 70.0 in | Wt 185.4 lb

## 2021-10-28 DIAGNOSIS — G5603 Carpal tunnel syndrome, bilateral upper limbs: Secondary | ICD-10-CM

## 2021-10-28 DIAGNOSIS — I10 Essential (primary) hypertension: Secondary | ICD-10-CM | POA: Diagnosis not present

## 2021-10-28 DIAGNOSIS — R0602 Shortness of breath: Secondary | ICD-10-CM

## 2021-10-28 DIAGNOSIS — R0789 Other chest pain: Secondary | ICD-10-CM | POA: Diagnosis not present

## 2021-10-28 DIAGNOSIS — J3089 Other allergic rhinitis: Secondary | ICD-10-CM | POA: Diagnosis not present

## 2021-11-26 ENCOUNTER — Ambulatory Visit: Payer: Medicare Other | Attending: Interventional Cardiology | Admitting: Interventional Cardiology

## 2021-11-26 ENCOUNTER — Encounter: Payer: Self-pay | Admitting: Interventional Cardiology

## 2021-11-26 VITALS — BP 120/68 | HR 67 | Ht 70.0 in | Wt 180.0 lb

## 2021-11-26 DIAGNOSIS — R072 Precordial pain: Secondary | ICD-10-CM

## 2021-11-26 DIAGNOSIS — I739 Peripheral vascular disease, unspecified: Secondary | ICD-10-CM

## 2021-11-26 DIAGNOSIS — I6523 Occlusion and stenosis of bilateral carotid arteries: Secondary | ICD-10-CM | POA: Diagnosis not present

## 2021-11-26 DIAGNOSIS — I10 Essential (primary) hypertension: Secondary | ICD-10-CM

## 2021-11-26 DIAGNOSIS — E785 Hyperlipidemia, unspecified: Secondary | ICD-10-CM | POA: Diagnosis not present

## 2021-11-26 LAB — BASIC METABOLIC PANEL
BUN/Creatinine Ratio: 11 (ref 10–24)
BUN: 13 mg/dL (ref 8–27)
CO2: 22 mmol/L (ref 20–29)
Calcium: 9.8 mg/dL (ref 8.6–10.2)
Chloride: 102 mmol/L (ref 96–106)
Creatinine, Ser: 1.17 mg/dL (ref 0.76–1.27)
Glucose: 96 mg/dL (ref 70–99)
Potassium: 4.2 mmol/L (ref 3.5–5.2)
Sodium: 138 mmol/L (ref 134–144)
eGFR: 68 mL/min/{1.73_m2} (ref >59)

## 2021-11-26 MED ORDER — ROSUVASTATIN CALCIUM 20 MG PO TABS: 20.0000 mg | ORAL_TABLET | Freq: Every day | ORAL | 3 refills | Status: DC

## 2021-11-26 MED ORDER — METOPROLOL TARTRATE 100 MG PO TABS: 100.0000 mg | ORAL_TABLET | Freq: Once | ORAL | 0 refills | Status: DC

## 2021-11-26 NOTE — Patient Instructions (Addendum)
Medication Instructions:  Your physician has recommended you make the following change in your medication:  Start taking Crestor/rosuvastatin '20mg'$  daily  *If you need a refill on your cardiac medications before your next appointment, please call your pharmacy*   Lab Work: Bmet today Liver Function, lipids 2-3 months If you have labs (blood work) drawn today and your tests are completely normal, you will receive your results only by: [place] (if you have MyChart) OR A paper copy in the mail If you have any lab test that is abnormal or we need to change your treatment, we will call you to review the results.   Testing/Procedures: Your physician has requested that you have cardiac CT. Cardiac computed tomography (CT) is a painless test that uses an x-ray machine to take clear, detailed pictures of your heart. For further information please visit [email or web address]. Please follow instruction sheet as given.     Follow-Up: At [facility], you and your health needs are our priority.  As part of our continuing mission to provide you with exceptional heart care, we have created designated Provider Care Teams.  These Care Teams include your primary Cardiologist (physician) and Advanced Practice Providers (APPs -  Physician Assistants and Nurse Practitioners) who all work together to provide you with the care you need, when you need it.   Your next appointment:   Based on CT results  The format for your next appointment:   In Person  Provider:   Dr.Varanasi  Other [facility] [address] [place], [place] [address] [phone]   Please arrive at the [facility] and Children's Entrance (Entrance C2) of [facility] 30 minutes prior to test start time. You can use the FREE valet parking offered at entrance C (encouraged to control the heart rate for the test)  Proceed to the [facility] Radiology Department (first floor) to  check-in and test prep.  All radiology patients and guests should use entrance C2 at [facility], accessed from [facility], even though the hospital's physical address listed is [address]     Please follow these instructions carefully (unless otherwise directed):  Hold all erectile dysfunction medications at least 3 days (72 hrs) prior to test. (Ie viagra, cialis, sildenafil, tadalafil, etc) We will administer nitroglycerin during this exam.   On the Night Before the Test: Be sure to Drink plenty of water. Do not consume any caffeinated/decaffeinated beverages or chocolate 12 hours prior to your test. Do not take any antihistamines 12 hours prior to your test.  On the Day of the Test: Drink plenty of water until 1 hour prior to the test. You may take your regular medications prior to the test.  Take metoprolol (Lopressor) two hours prior to test. HOLD Furosemide/Hydrochlorothiazide morning of the test. FEMALES- please wear underwire-free bra if available, avoid dresses & tight clothing       After the Test: Drink plenty of water. After receiving IV contrast, you may experience a mild flushed feeling. This is normal. On occasion, you may experience a mild rash up to 24 hours after the test. This is not dangerous. If this occurs, you can take Benadryl 25 mg and increase your fluid intake. If you experience trouble breathing, this can be serious. If it is severe call 911 IMMEDIATELY. If it is mild, please call our office. If you take any of these medications: Glipizide/Metformin, Avandament, Glucavance, please do not take 48 hours after  completing test unless otherwise instructed.  We will call to schedule your test 2-4 weeks out understanding that some insurance companies will need an authorization prior to the service being performed.   For non-scheduling related questions, please contact the cardiac imaging nurse navigator should you have any  questions/concerns: Marchia Bond, Cardiac Imaging Nurse Navigator Gordy Clement, Cardiac Imaging Nurse Navigator [facility] Direct Office Dial: [phone]   For scheduling needs, including cancellations and rescheduling, please call [place], [phone].

## 2021-11-26 NOTE — Progress Notes (Signed)
Cardiology Office Note   Date:  11/26/2021   ID:  Andres Day 1955-12-03, MRN [id number]  PCP:  Andres Freshwater, NP    No chief complaint on file.  Chest pain  Wt Readings from Last 3 Encounters:  11/26/21 180 lb (81.6 kg)  10/28/21 185 lb 6.4 oz (84.1 kg)  04/30/21 192 lb 12.8 oz (87.5 kg)       History of Present Illness: Andres Day is a 66 y.o. male who is being seen today for the evaluation of chest pain at the request of Andres Freshwater, NP.   Saw cardiology in 2014 with negative w/u with exercise stress test. Had not gone to doctor until 2023.  2016- had some DOE and chest pain while using push mower for 120 feet.  Switched to riding mower.  More recently, he feels a pain in his chest with walking 100 feet.  Can get worse with more walking.  BP meds started in 2023, sx improved, but not completely resolved.   Father with heart disease.  No heart disease in siblings.   Past Medical History:  Diagnosis Date   Alcohol abuse, in remission    quit 1995    Past Surgical History:  Procedure Laterality Date   HERNIA REPAIR Right 2008   inguinal     Current Outpatient Medications  Medication Sig Dispense Refill   aspirin 81 MG tablet Take 81 mg by mouth daily.     fluticasone (FLONASE) 50 MCG/ACT nasal spray Place 2 sprays into the nose daily. 16 g 6   lisinopril (ZESTRIL) 5 MG tablet Take 1 tablet (5 mg total) by mouth daily. 90 tablet 3   triamcinolone (KENALOG) 0.025 % cream Apply 1 application topically 2 (two) times daily. 80 g 2   No current facility-administered medications for this visit.    Allergies:   Patient has no known allergies.    Social History:  The patient  reports that he has quit smoking. He has never used smokeless tobacco. He reports that he does not drink alcohol and does not use drugs.   Family History:  The patient's family history includes Aneurysm in his mother; CAD (age of onset: 65) in his father; Diabetes  in his father.    ROS:  Please see the history of present illness.   Otherwise, review of systems are positive for exertional chest pain.   All other systems are reviewed and negative.    PHYSICAL EXAM: VS:  BP 120/68   Pulse 67   Ht '5\' 10"'$  (1.778 m)   Wt 180 lb (81.6 kg)   SpO2 97%   BMI 25.83 kg/m  , BMI Body mass index is 25.83 kg/m. GEN: Well nourished, well developed, in no acute distress HEENT: normal Neck: no JVD, carotid bruits, or masses Cardiac: RRR; no murmurs, rubs, or gallops,no edema  Respiratory:  clear to auscultation bilaterally, normal work of breathing GI: soft, nontender, nondistended, + BS MS: no deformity or atrophy Skin: warm and dry, no rash Neuro:  Strength and sensation are intact Psych: euthymic mood, full affect   EKG:   The ekg ordered 2/23 demonstrates NSR, no ST changes   Recent Labs: 04/23/2021: ALT 10; BUN 15; Creatinine, Ser 1.25; Hemoglobin 15.2; Platelets 233; Potassium 4.8; Sodium 141; TSH 5.700   Lipid Panel    Component Value Date/Time   CHOL 193 04/23/2021 0857   TRIG 75 04/23/2021 0857   HDL 51 04/23/2021 0857  CHOLHDL 3.8 04/23/2021 0857   CHOLHDL 4 07/10/2012 0826   VLDL 9.2 07/10/2012 0826   LDLCALC 128 (H) 04/23/2021 0857   LDLDIRECT 153.7 07/10/2012 0826     Other studies Reviewed: Additional studies/ records that were reviewed today with results demonstrating: carotid DOppler showing minimal carotid disease.   ASSESSMENT AND PLAN:  Chest pain: Improved now that BP has been controlled.  Symptoms not completely gone.  Predictable based on duration of walking.  We discussed cath vs. CTA.  Based on symptoms we will plan for coronary CTA.  Metoprolol 100 mg daily.  If he has high risk disease by CTA or refractory anginal symptoms, will have to consider cardiac cath.  He understands and is agreeable to cath if needed in the future. HTN:  The current medical regimen is effective;  continue present plan and medications.  Can  continue ACE inhibitor.  Blood pressure well controlled. PAD: noted in carotid.  Needs statin.  Start rosuvastatin 20 mg daily.  Check liver and lipid tests in about 3 months.   Hyperlipidemia: LDL 129.  Given his atherosclerosis noted, would at least like to see LDL less than 100 but ideally closer to 70.   Current medicines are reviewed at length with the patient today.  The patient concerns regarding his medicines were addressed.  The following changes have been made:  No change  Labs/ tests ordered today include:  No orders of the defined types were placed in this encounter.   Recommend 150 minutes/week of aerobic exercise Low fat, low carb, high fiber diet recommended  Disposition:   FU in based on the results   Signed, Larae Grooms, MD  11/26/2021 10:00 AM    [address], [place], [place]  [address] Phone: [phone]; Fax: [phone]

## 2021-12-08 ENCOUNTER — Ambulatory Visit: Payer: Medicare Other | Admitting: Dermatology

## 2021-12-15 ENCOUNTER — Telehealth (HOSPITAL_COMMUNITY): Payer: Self-pay | Admitting: Emergency Medicine

## 2021-12-15 NOTE — Telephone Encounter (Signed)
Attempted to call patient regarding upcoming cardiac CT appointment. °Left message on voicemail with name and callback number °Nadiah Corbit RN Navigator Cardiac Imaging °[facility] °[phone] Office °[phone] Cell ° °

## 2021-12-15 NOTE — Telephone Encounter (Signed)
Reaching out to patient to offer assistance regarding upcoming cardiac imaging study; pt verbalizes understanding of appt date/time, parking situation and where to check in, pre-test NPO status and medications ordered, and verified current allergies; name and call back number provided for further questions should they arise Andres Bond RN Navigator Cardiac Imaging [facility] [phone] office [phone] cell  Arrival 930 w/c entrance '100mg'$  metoprolol tartrate  Denies iv issues Aware nitro

## 2021-12-16 ENCOUNTER — Ambulatory Visit (HOSPITAL_COMMUNITY)
Admission: RE | Admit: 2021-12-16 | Discharge: 2021-12-16 | Disposition: A | Discharge status: Home / Self Care | Payer: Medicare Other | Source: Ambulatory Visit | Attending: Interventional Cardiology | Admitting: Interventional Cardiology

## 2021-12-16 ENCOUNTER — Ambulatory Visit (HOSPITAL_BASED_OUTPATIENT_CLINIC_OR_DEPARTMENT_OTHER)
Admission: RE | Admit: 2021-12-16 | Discharge: 2021-12-16 | Disposition: A | Discharge status: Home / Self Care | Payer: Medicare Other | Source: Ambulatory Visit | Attending: Cardiovascular Disease | Admitting: Cardiovascular Disease

## 2021-12-16 ENCOUNTER — Other Ambulatory Visit (HOSPITAL_COMMUNITY): Payer: Medicare Other

## 2021-12-16 ENCOUNTER — Ambulatory Visit (HOSPITAL_COMMUNITY)
Admission: RE | Admit: 2021-12-16 | Discharge: 2021-12-16 | Disposition: A | Discharge status: Home / Self Care | Payer: Medicare Other | Source: Ambulatory Visit | Attending: Cardiovascular Disease | Admitting: Cardiovascular Disease

## 2021-12-16 ENCOUNTER — Other Ambulatory Visit: Payer: Self-pay | Admitting: Cardiovascular Disease

## 2021-12-16 DIAGNOSIS — I2510 Atherosclerotic heart disease of native coronary artery without angina pectoris: Secondary | ICD-10-CM

## 2021-12-16 DIAGNOSIS — I2583 Coronary atherosclerosis due to lipid rich plaque: Secondary | ICD-10-CM

## 2021-12-16 DIAGNOSIS — I700 Atherosclerosis of aorta: Secondary | ICD-10-CM | POA: Insufficient documentation

## 2021-12-16 DIAGNOSIS — R931 Abnormal findings on diagnostic imaging of heart and coronary circulation: Secondary | ICD-10-CM | POA: Diagnosis not present

## 2021-12-16 DIAGNOSIS — R072 Precordial pain: Secondary | ICD-10-CM | POA: Diagnosis not present

## 2021-12-16 MED ORDER — NITROGLYCERIN 0.4 MG SL SUBL
0.8000 mg | SUBLINGUAL_TABLET | SUBLINGUAL | Status: DC | PRN
  Administered 2021-12-16: 0.8 mg via SUBLINGUAL

## 2021-12-16 MED ORDER — NITROGLYCERIN 0.4 MG SL SUBL
SUBLINGUAL_TABLET | SUBLINGUAL | Status: AC
  Filled 2021-12-16: qty 2

## 2021-12-16 MED ORDER — IOHEXOL 350 MG/ML SOLN
100.0000 mL | Freq: Once | INTRAVENOUS | Status: AC | PRN
  Administered 2021-12-16: 100 mL via INTRAVENOUS

## 2021-12-23 ENCOUNTER — Telehealth: Payer: Self-pay | Admitting: Interventional Cardiology

## 2021-12-23 NOTE — Telephone Encounter (Signed)
Patient returning call for CT results. 

## 2021-12-23 NOTE — Telephone Encounter (Signed)
I spoke with patient and reviewed Cardiac CT results with him.  Appointment for lipids and liver profiles scheduled for 02/08/22

## 2022-02-08 ENCOUNTER — Ambulatory Visit: Payer: Medicare Other | Attending: Interventional Cardiology

## 2022-02-08 DIAGNOSIS — E785 Hyperlipidemia, unspecified: Secondary | ICD-10-CM | POA: Diagnosis not present

## 2022-02-08 DIAGNOSIS — R072 Precordial pain: Secondary | ICD-10-CM

## 2022-02-09 LAB — HEPATIC FUNCTION PANEL
ALT: 11 IU/L (ref 0–44)
AST: 16 IU/L (ref 0–40)
Albumin: 4.4 g/dL (ref 3.9–4.9)
Alkaline Phosphatase: 82 IU/L (ref 44–121)
Bilirubin Total: 0.4 mg/dL (ref 0.0–1.2)
Bilirubin, Direct: 0.15 mg/dL (ref 0.00–0.40)
Total Protein: 6.8 g/dL (ref 6.0–8.5)

## 2022-02-09 LAB — LIPID PANEL
Chol/HDL Ratio: 2.5 ratio (ref 0.0–5.0)
Cholesterol, Total: 138 mg/dL (ref 100–199)
HDL: 55 mg/dL (ref >39)
LDL Chol Calc (NIH): 72 mg/dL (ref 0–99)
Triglycerides: 49 mg/dL (ref 0–149)
VLDL Cholesterol Cal: 11 mg/dL (ref 5–40)

## 2022-03-09 NOTE — Progress Notes (Unsigned)
Established patient visit   Patient: Andres Day   DOB: 08-31-1955   67 y.o. Male  MRN: [id number] Visit Date: 03/10/2022   No chief complaint on file.  Subjective    HPI  Follow up  -hypertension  --generally well controlled.  -intermittent chest pain  --referred to cardiology at last visit  -recent lipid panel normal  -normal liver functions  --takes crestor 20 mg daily  -should have fasting labs done prior to next visit     Medications: Outpatient Medications Prior to Visit  Medication Sig   aspirin 81 MG tablet Take 81 mg by mouth daily.   fluticasone (FLONASE) 50 MCG/ACT nasal spray Place 2 sprays into the nose daily.   lisinopril (ZESTRIL) 5 MG tablet Take 1 tablet (5 mg total) by mouth daily.   metoprolol tartrate (LOPRESSOR) 100 MG tablet Take 1 tablet (100 mg total) by mouth once for 1 dose. Take 1 tablet (100 mg total) two hours prior to CT scan.   rosuvastatin (CRESTOR) 20 MG tablet Take 1 tablet (20 mg total) by mouth daily.   triamcinolone (KENALOG) 0.025 % cream Apply 1 application topically 2 (two) times daily.   No facility-administered medications prior to visit.    Review of Systems  {Labs (Optional):23779}   Objective    There were no vitals filed for this visit. There is no height or weight on file to calculate BMI.  BP Readings from Last 3 Encounters:  12/16/21 119/69  11/26/21 120/68  10/28/21 110/60    Wt Readings from Last 3 Encounters:  11/26/21 180 lb (81.6 kg)  10/28/21 185 lb 6.4 oz (84.1 kg)  04/30/21 192 lb 12.8 oz (87.5 kg)    Physical Exam  ***  No results found for any visits on 03/10/22.  Assessment & Plan     Problem List Items Addressed This Visit   None    No follow-ups on file.         Ronnell Freshwater, NP  [facility] [phone] (phone) [phone] (fax)  [place]

## 2022-03-10 ENCOUNTER — Encounter: Payer: Self-pay | Admitting: Nurse Practitioner

## 2022-03-10 ENCOUNTER — Ambulatory Visit (INDEPENDENT_AMBULATORY_CARE_PROVIDER_SITE_OTHER): Payer: Medicare Other | Admitting: Nurse Practitioner

## 2022-03-10 VITALS — BP 123/75 | HR 63 | Resp 20 | Ht 70.0 in | Wt 179.0 lb

## 2022-03-10 DIAGNOSIS — I2510 Atherosclerotic heart disease of native coronary artery without angina pectoris: Secondary | ICD-10-CM

## 2022-03-10 DIAGNOSIS — Z6825 Body mass index (BMI) 25.0-25.9, adult: Secondary | ICD-10-CM

## 2022-03-10 DIAGNOSIS — I2583 Coronary atherosclerosis due to lipid rich plaque: Secondary | ICD-10-CM

## 2022-03-10 DIAGNOSIS — I10 Essential (primary) hypertension: Secondary | ICD-10-CM | POA: Diagnosis not present

## 2022-04-08 DIAGNOSIS — H5203 Hypermetropia, bilateral: Secondary | ICD-10-CM | POA: Diagnosis not present

## 2022-04-08 DIAGNOSIS — H2513 Age-related nuclear cataract, bilateral: Secondary | ICD-10-CM | POA: Diagnosis not present

## 2022-04-14 ENCOUNTER — Other Ambulatory Visit: Payer: Self-pay | Admitting: Nurse Practitioner

## 2022-04-14 DIAGNOSIS — I10 Essential (primary) hypertension: Secondary | ICD-10-CM

## 2022-06-11 ENCOUNTER — Other Ambulatory Visit: Payer: Self-pay | Admitting: Nurse Practitioner

## 2022-06-11 DIAGNOSIS — Z0000 Encounter for general adult medical examination without abnormal findings: Secondary | ICD-10-CM

## 2022-06-11 DIAGNOSIS — I10 Essential (primary) hypertension: Secondary | ICD-10-CM

## 2022-06-11 DIAGNOSIS — Z130 Encounter for screening for diseases of the blood and blood-forming organs and certain disorders involving the immune mechanism: Secondary | ICD-10-CM

## 2022-06-11 DIAGNOSIS — E039 Hypothyroidism, unspecified: Secondary | ICD-10-CM

## 2022-07-05 ENCOUNTER — Other Ambulatory Visit: Payer: Medicare Other

## 2022-07-05 DIAGNOSIS — Z0000 Encounter for general adult medical examination without abnormal findings: Secondary | ICD-10-CM

## 2022-07-05 DIAGNOSIS — Z1329 Encounter for screening for other suspected endocrine disorder: Secondary | ICD-10-CM | POA: Diagnosis not present

## 2022-07-05 DIAGNOSIS — Z1321 Encounter for screening for nutritional disorder: Secondary | ICD-10-CM | POA: Diagnosis not present

## 2022-07-05 DIAGNOSIS — Z13228 Encounter for screening for other metabolic disorders: Secondary | ICD-10-CM | POA: Diagnosis not present

## 2022-07-05 DIAGNOSIS — I10 Essential (primary) hypertension: Secondary | ICD-10-CM

## 2022-07-05 DIAGNOSIS — Z131 Encounter for screening for diabetes mellitus: Secondary | ICD-10-CM | POA: Diagnosis not present

## 2022-07-05 DIAGNOSIS — E039 Hypothyroidism, unspecified: Secondary | ICD-10-CM

## 2022-07-05 DIAGNOSIS — Z130 Encounter for screening for diseases of the blood and blood-forming organs and certain disorders involving the immune mechanism: Secondary | ICD-10-CM

## 2022-07-06 LAB — COMPREHENSIVE METABOLIC PANEL
ALT: 11 IU/L (ref 0–44)
AST: 16 IU/L (ref 0–40)
Albumin/Globulin Ratio: 1.7 (ref 1.2–2.2)
Albumin: 4.3 g/dL (ref 3.9–4.9)
Alkaline Phosphatase: 80 IU/L (ref 44–121)
BUN/Creatinine Ratio: 14 (ref 10–24)
BUN: 16 mg/dL (ref 8–27)
Bilirubin Total: 0.5 mg/dL (ref 0.0–1.2)
CO2: 23 mmol/L (ref 20–29)
Calcium: 9.8 mg/dL (ref 8.6–10.2)
Chloride: 103 mmol/L (ref 96–106)
Creatinine, Ser: 1.12 mg/dL (ref 0.76–1.27)
Globulin, Total: 2.6 g/dL (ref 1.5–4.5)
Glucose: 91 mg/dL (ref 70–99)
Potassium: 4.6 mmol/L (ref 3.5–5.2)
Sodium: 140 mmol/L (ref 134–144)
Total Protein: 6.9 g/dL (ref 6.0–8.5)
eGFR: 72 mL/min/{1.73_m2} (ref >59)

## 2022-07-06 LAB — CBC WITH DIFFERENTIAL/PLATELET
Basophils Absolute: 0 10*3/uL (ref 0.0–0.2)
Basos: 1 %
EOS (ABSOLUTE): 0.1 10*3/uL (ref 0.0–0.4)
Eos: 2 %
Hematocrit: 44.2 % (ref 37.5–51.0)
Hemoglobin: 14.2 g/dL (ref 13.0–17.7)
Immature Grans (Abs): 0 10*3/uL (ref 0.0–0.1)
Immature Granulocytes: 0 %
Lymphocytes Absolute: 1 10*3/uL (ref 0.7–3.1)
Lymphs: 20 %
MCH: 27.9 pg (ref 26.6–33.0)
MCHC: 32.1 g/dL (ref 31.5–35.7)
MCV: 87 fL (ref 79–97)
Monocytes Absolute: 0.4 10*3/uL (ref 0.1–0.9)
Monocytes: 8 %
Neutrophils Absolute: 3.6 10*3/uL (ref 1.4–7.0)
Neutrophils: 69 %
Platelets: 224 10*3/uL (ref 150–450)
RBC: 5.09 x10E6/uL (ref 4.14–5.80)
RDW: 13.4 % (ref 11.6–15.4)
WBC: 5.1 10*3/uL (ref 3.4–10.8)

## 2022-07-06 LAB — LIPID PANEL
Chol/HDL Ratio: 2.6 ratio (ref 0.0–5.0)
Cholesterol, Total: 146 mg/dL (ref 100–199)
HDL: 56 mg/dL (ref >39)
LDL Chol Calc (NIH): 78 mg/dL (ref 0–99)
Triglycerides: 57 mg/dL (ref 0–149)
VLDL Cholesterol Cal: 12 mg/dL (ref 5–40)

## 2022-07-06 LAB — HEMOGLOBIN A1C
Est. average glucose Bld gHb Est-mCnc: 128 mg/dL
Hgb A1c MFr Bld: 6.1 % — ABNORMAL HIGH (ref 4.8–5.6)

## 2022-07-06 LAB — TSH: TSH: 5.91 u[IU]/mL — ABNORMAL HIGH (ref 0.450–4.500)

## 2022-07-09 ENCOUNTER — Telehealth: Payer: Self-pay | Admitting: *Deleted

## 2022-07-09 NOTE — Telephone Encounter (Signed)
LVM for pt to call back to see if he would be willing to change his AWV to be done on a Tues/Thurs with the AWV team.  This appointment will be by phone.  Please assist in changing this if he is agreeable.

## 2022-07-12 ENCOUNTER — Encounter: Payer: Medicare Other | Admitting: Nurse Practitioner

## 2022-07-19 ENCOUNTER — Encounter: Payer: Medicare Other | Admitting: Nurse Practitioner

## 2022-07-20 ENCOUNTER — Ambulatory Visit (INDEPENDENT_AMBULATORY_CARE_PROVIDER_SITE_OTHER): Payer: Medicare Other

## 2022-07-20 VITALS — Ht 70.0 in | Wt 175.0 lb

## 2022-07-20 DIAGNOSIS — Z0000 Encounter for general adult medical examination without abnormal findings: Secondary | ICD-10-CM | POA: Diagnosis not present

## 2022-07-20 DIAGNOSIS — Z1159 Encounter for screening for other viral diseases: Secondary | ICD-10-CM

## 2022-07-20 NOTE — Progress Notes (Signed)
Subjective:   Andres Day is a 67 y.o. male who presents for Medicare Annual/Subsequent preventive examination.  Review of Systems    Virtual Visit via Telephone Note  I connected with  Andres Day on 07/20/22 at 10:30 AM EDT by telephone and verified that I am speaking with the correct person using two identifiers.  Location: Patient: Home Provider: Office Persons participating in the virtual visit: patient/Nurse Health Advisor   I discussed the limitations, risks, security and privacy concerns of performing an evaluation and management service by telephone and the availability of in person appointments. The patient expressed understanding and agreed to proceed.  Interactive audio and video telecommunications were attempted between this nurse and patient, however failed, due to patient having technical difficulties OR patient did not have access to video capability.  We continued and completed visit with audio only.  Some vital signs may be absent or patient reported.   Tillie Rung, LPN  Cardiac Risk Factors include: advanced age (>26men, >81 women);male gender;hypertension     Objective:    Today's Vitals   07/20/22 1032  Weight: 175 lb (79.4 kg)  Height: 5\' 10"  (1.778 m)   Body mass index is 25.11 kg/m.     07/20/2022   10:40 AM  Advanced Directives  Does Patient Have a Medical Advance Directive? No  Would patient like information on creating a medical advance directive? No - Patient declined    Current Medications (verified) Outpatient Encounter Medications as of 07/20/2022  Medication Sig   aspirin 81 MG tablet Take 81 mg by mouth daily.   lisinopril (ZESTRIL) 5 MG tablet TAKE 1 TABLET (5 MG TOTAL) BY MOUTH DAILY.   rosuvastatin (CRESTOR) 20 MG tablet Take 1 tablet (20 mg total) by mouth daily.   triamcinolone (KENALOG) 0.025 % cream Apply 1 application topically 2 (two) times daily.   No facility-administered encounter medications on file as  of 07/20/2022.    Allergies (verified) Patient has no known allergies.   History: Past Medical History:  Diagnosis Date   Alcohol abuse, in remission    quit 1995   Past Surgical History:  Procedure Laterality Date   HERNIA REPAIR Right 2008   inguinal   Family History  Problem Relation Age of Onset   Colon cancer Neg Hx    Prostate cancer Neg Hx    CAD Father 69   Diabetes Father        many other family members   Aneurysm Mother        type?   Social History   Socioeconomic History   Marital status: Married    Spouse name: Not on file   Number of children: 2   Years of education: Not on file   Highest education level: Not on file  Occupational History   Occupation: Horticulturist, commercial   Tobacco Use   Smoking status: Former   Smokeless tobacco: Never   Tobacco comments:    quit 1999, used to smoke 2.5 ppd  Substance and Sexual Activity   Alcohol use: No    Comment: former abuse    Drug use: No   Sexual activity: Not Currently  Other Topics Concern   Not on file  Social History Narrative   Finished HS   Social Determinants of Health   Financial Resource Strain: Low Risk  (07/20/2022)   Overall Financial Resource Strain (CARDIA)    Difficulty of Paying Living Expenses: Not hard at all  Food Insecurity: No Food Insecurity (  07/20/2022)   Hunger Vital Sign    Worried About Running Out of Food in the Last Year: Never true    Ran Out of Food in the Last Year: Never true  Transportation Needs: No Transportation Needs (07/20/2022)   PRAPARE - Administrator, Civil Service (Medical): No    Lack of Transportation (Non-Medical): No  Physical Activity: Sufficiently Active (07/20/2022)   Exercise Vital Sign    Days of Exercise per Week: 7 days    Minutes of Exercise per Session: 30 min  Stress: No Stress Concern Present (07/20/2022)   [employer] of Occupational Health - Occupational Stress Questionnaire    Feeling of Stress : Not at all  Social  Connections: Moderately Isolated (07/20/2022)   Social Connection and Isolation Panel [NHANES]    Frequency of Communication with Friends and Family: More than three times a week    Frequency of Social Gatherings with Friends and Family: More than three times a week    Attends Religious Services: Never    Database administrator or Organizations: No    Attends Engineer, structural: Never    Marital Status: Married    Tobacco Counseling Counseling given: Not Answered Tobacco comments: quit 1999, used to smoke 2.5 ppd   Clinical Intake:  Pre-visit preparation completed: No  Pain : No/denies pain     BMI - recorded: 25.11 Nutritional Status: BMI 25 -29 Overweight Nutritional Risks: None Diabetes: No  How often do you need to have someone help you when you read instructions, pamphlets, or other written materials from your doctor or pharmacy?: 1 - Never  Diabetic?  No  Interpreter Needed?: No  Information entered by :: Theresa Mulligan LPN   Activities of Daily Living    07/20/2022   10:38 AM 03/10/2022   10:06 AM  In your present state of health, do you have any difficulty performing the following activities:  Hearing? 0 0  Vision? 0 0  Difficulty concentrating or making decisions? 0 0  Walking or climbing stairs? 0 0  Dressing or bathing? 0 0  Doing errands, shopping? 0 0  Preparing Food and eating ? N   Using the Toilet? N   In the past six months, have you accidently leaked urine? N   Do you have problems with loss of bowel control? N   Managing your Medications? N   Managing your Finances? N   Housekeeping or managing your Housekeeping? N     Patient Care Team: Carlean Jews, NP as PCP - General (Family Medicine) Corky Crafts, MD as PCP - Cardiology (Cardiology)  Indicate any recent Medical Services you may have received from other than [facility] providers in the past year (date may be approximate).     Assessment:   This is a routine  wellness examination for Andres Day.  Hearing/Vision screen Hearing Screening - Comments:: Denies hearing difficulties   Vision Screening - Comments:: Wears rx glasses - up to date with routine eye exams with  Maggie Schwalbe  Dietary issues and exercise activities discussed: Current Exercise Habits: Home exercise routine, Type of exercise: walking, Time (Minutes): 30, Frequency (Times/Week): 7, Weekly Exercise (Minutes/Week): 210, Intensity: Moderate, Exercise limited by: None identified   Goals Addressed               This Visit's Progress     No current goals (pt-stated)         Depression Screen    07/20/2022   10:37  AM 03/10/2022   10:05 AM 10/28/2021    9:20 AM 04/30/2021   10:59 AM 04/15/2021   10:06 AM  PHQ 2/9 Scores  PHQ - 2 Score 0 0 0 0 0  PHQ- 9 Score  2 2 1 1     Fall Risk    07/20/2022   10:39 AM 03/10/2022   10:06 AM 04/30/2021   10:59 AM 04/15/2021   10:07 AM  Fall Risk   Falls in the past year? 0 1 0 0  Number falls in past yr: 0 0 0 0  Injury with Fall? 0 0 0 0  Risk for fall due to : No Fall Risks     Follow up Falls prevention discussed  Falls evaluation completed Falls evaluation completed    FALL RISK PREVENTION PERTAINING TO THE HOME:  Any stairs in or around the home? Yes  If so, are there any without handrails? No  Home free of loose throw rugs in walkways, pet beds, electrical cords, etc? Yes  Adequate lighting in your home to reduce risk of falls? Yes   ASSISTIVE DEVICES UTILIZED TO PREVENT FALLS:  Life alert? No  Use of a cane, walker or w/c? No  Grab bars in the bathroom? No  Shower chair or bench in shower? No  Elevated toilet seat or a handicapped toilet? Yes  TIMED UP AND GO:  Was the test performed? No . Audio Visit   Cognitive Function:        07/20/2022   10:40 AM 04/30/2021   11:00 AM  6CIT Screen  What Year? 0 points 0 points  What month? 0 points 0 points  What time? 0 points 0 points  Count back from 20 0 points 0  points  Months in reverse 0 points 0 points  Repeat phrase 0 points 0 points  Total Score 0 points 0 points    Immunizations Immunization History  Administered Date(s) Administered   COVID-19, mRNA, vaccine(Comirnaty)12 years and older 12/14/2021   Fluad Quad(high Dose 65+) 12/14/2021   Influenza-Unspecified 12/15/2018, 12/21/2019, 12/03/2020   PFIZER(Purple Top)SARS-COV-2 Vaccination 04/03/2019, 04/18/2019, 12/21/2019, 12/03/2020   Pfizer Covid-19 Vaccine Bivalent Booster 15yrs & up 12/14/2021    TDAP status: Due, Education has been provided regarding the importance of this vaccine. Advised may receive this vaccine at local pharmacy or Health Dept. Aware to provide a copy of the vaccination record if obtained from local pharmacy or Health Dept. Verbalized acceptance and understanding.  Flu Vaccine status: Up to date  Pneumococcal vaccine status: Due, Education has been provided regarding the importance of this vaccine. Advised may receive this vaccine at local pharmacy or Health Dept. Aware to provide a copy of the vaccination record if obtained from local pharmacy or Health Dept. Verbalized acceptance and understanding.  Covid-19 vaccine status: Completed vaccines  Qualifies for Shingles Vaccine? Yes   Zostavax completed No   Shingrix Completed?: No.    Education has been provided regarding the importance of this vaccine. Patient has been advised to call insurance company to determine out of pocket expense if they have not yet received this vaccine. Advised may also receive vaccine at local pharmacy or Health Dept. Verbalized acceptance and understanding.  Screening Tests Health Maintenance  Topic Date Due   DTaP/Tdap/Td (1 - Tdap) Never done   COVID-19 Vaccine (6 - 2023-24 season) 08/05/2022 (Originally 02/08/2022)   Zoster Vaccines- Shingrix (1 of 2) 10/20/2022 (Originally [birth year]07/1973)   Pneumonia Vaccine 60+ Years old (1 of 1 - PCV) 07/20/2023 (  Originally 02/08/2019)   COLONOSCOPY  (Pts 45-33yrs Insurance coverage will need to be confirmed)  07/20/2023 (Originally 02/08/1999)   Hepatitis C Screening  07/20/2023 (Originally [birth year]08/1972)   INFLUENZA VACCINE  10/07/2022   Medicare Annual Wellness (AWV)  07/20/2023   HPV VACCINES  Aged Out    Health Maintenance  Health Maintenance Due  Topic Date Due   DTaP/Tdap/Td (1 - Tdap) Never done    Colorectal cancer screening: Referral to GI placed Patient declined. Pt aware the office will call re: appt.  Lung Cancer Screening: (Low Dose CT Chest recommended if Age 70-80 years, 30 pack-year currently smoking OR have quit w/in 15years.) does not qualify.     Additional Screening:  Hepatitis C Screening: does qualify; Deferred  Vision Screening: Recommended annual ophthalmology exams for early detection of glaucoma and other disorders of the eye. Is the patient up to date with their annual eye exam?  Yes  Who is the provider or what is the name of the office in which the patient attends annual eye exams? [facility] If pt is not established with a provider, would they like to be referred to a provider to establish care? No .   Dental Screening: Recommended annual dental exams for proper oral hygiene  Community Resource Referral / Chronic Care Management:  CRR required this visit?  No   CCM required this visit?  No      Plan:     I have personally reviewed and noted the following in the patient's chart:   Medical and social history Use of alcohol, tobacco or illicit drugs  Current medications and supplements including opioid prescriptions. Patient is not currently taking opioid prescriptions. Functional ability and status Nutritional status Physical activity Advanced directives List of other physicians Hospitalizations, surgeries, and ER visits in previous 12 months Vitals Screenings to include cognitive, depression, and falls Referrals and appointments  In addition, I have reviewed and discussed with  patient certain preventive protocols, quality metrics, and best practice recommendations. A written personalized care plan for preventive services as well as general preventive health recommendations were provided to patient.     Tillie Rung, LPN   [id number]   Nurse Notes: Patient due Hep-C Screening

## 2022-07-20 NOTE — Patient Instructions (Addendum)
[name] , Thank you for taking time to come for your Medicare Wellness Visit. I appreciate your ongoing commitment to your health goals. Please review the following plan we discussed and let me know if I can assist you in the future.   These are the goals we discussed:  Goals       No current goals (pt-stated)        This is a list of the screening recommended for you and due dates:  Health Maintenance  Topic Date Due   DTaP/Tdap/Td vaccine (1 - Tdap) Never done   COVID-19 Vaccine (6 - 2023-24 season) 08/05/2022*   Zoster (Shingles) Vaccine (1 of 2) 10/20/2022*   Pneumonia Vaccine (1 of 1 - PCV) 07/20/2023*   Colon Cancer Screening  07/20/2023*   Hepatitis C Screening: USPSTF Recommendation to screen - Ages 18-79 yo.  07/20/2023*   Flu Shot  10/07/2022   Medicare Annual Wellness Visit  07/20/2023   HPV Vaccine  Aged Out  *Topic was postponed. The date shown is not the original due date.    Advanced directives: Advance directive discussed with you today. Even though you declined this today, please call our office should you change your mind, and we can give you the proper paperwork for you to fill out.   Conditions/risks identified: None  Next appointment: Follow up in one year for your annual wellness visit.   Preventive Care 52 Years and Older, Male  Preventive care refers to lifestyle choices and visits with your health care provider that can promote health and wellness. What does preventive care include? A yearly physical exam. This is also called an annual well check. Dental exams once or twice a year. Routine eye exams. Ask your health care provider how often you should have your eyes checked. Personal lifestyle choices, including: Daily care of your teeth and gums. Regular physical activity. Eating a healthy diet. Avoiding tobacco and drug use. Limiting alcohol use. Practicing safe sex. Taking low doses of aspirin every day. Taking vitamin and mineral  supplements as recommended by your health care provider. What happens during an annual well check? The services and screenings done by your health care provider during your annual well check will depend on your age, overall health, lifestyle risk factors, and family history of disease. Counseling  Your health care provider may ask you questions about your: Alcohol use. Tobacco use. Drug use. Emotional well-being. Home and relationship well-being. Sexual activity. Eating habits. History of falls. Memory and ability to understand (cognition). Work and work Astronomer. Screening  You may have the following tests or measurements: Height, weight, and BMI. Blood pressure. Lipid and cholesterol levels. These may be checked every 5 years, or more frequently if you are over 78 years old. Skin check. Lung cancer screening. You may have this screening every year starting at age 42 if you have a 30-pack-year history of smoking and currently smoke or have quit within the past 15 years. Fecal occult blood test (FOBT) of the stool. You may have this test every year starting at age 81. Flexible sigmoidoscopy or colonoscopy. You may have a sigmoidoscopy every 5 years or a colonoscopy every 10 years starting at age 54. Prostate cancer screening. Recommendations will vary depending on your family history and other risks. Hepatitis C blood test. Hepatitis B blood test. Sexually transmitted disease (STD) testing. Diabetes screening. This is done by checking your blood sugar (glucose) after you have not eaten for a while (fasting). You may have this  done every 1-3 years. Abdominal aortic aneurysm (AAA) screening. You may need this if you are a current or former smoker. Osteoporosis. You may be screened starting at age 64 if you are at high risk. Talk with your health care provider about your test results, treatment options, and if necessary, the need for more tests. Vaccines  Your health care provider  may recommend certain vaccines, such as: Influenza vaccine. This is recommended every year. Tetanus, diphtheria, and acellular pertussis (Tdap, Td) vaccine. You may need a Td booster every 10 years. Zoster vaccine. You may need this after age 29. Pneumococcal 13-valent conjugate (PCV13) vaccine. One dose is recommended after age 1. Pneumococcal polysaccharide (PPSV23) vaccine. One dose is recommended after age 11. Talk to your health care provider about which screenings and vaccines you need and how often you need them. This information is not intended to replace advice given to you by your health care provider. Make sure you discuss any questions you have with your health care provider. Document Released: 03/21/2015 Document Revised: 11/12/2015 Document Reviewed: 12/24/2014 Elsevier Interactive Patient Education  2017 [place] Prevention in the Home Falls can cause injuries. They can happen to people of all ages. There are many things you can do to make your home safe and to help prevent falls. What can I do on the outside of my home? Regularly fix the edges of walkways and driveways and fix any cracks. Remove anything that might make you trip as you walk through a door, such as a raised step or threshold. Trim any bushes or trees on the path to your home. Use bright outdoor lighting. Clear any walking paths of anything that might make someone trip, such as rocks or tools. Regularly check to see if handrails are loose or broken. Make sure that both sides of any steps have handrails. Any raised decks and porches should have guardrails on the edges. Have any leaves, snow, or ice cleared regularly. Use sand or salt on walking paths during winter. Clean up any spills in your garage right away. This includes oil or grease spills. What can I do in the bathroom? Use night lights. Install grab bars by the toilet and in the tub and shower. Do not use towel bars as grab bars. Use  non-skid mats or decals in the tub or shower. If you need to sit down in the shower, use a plastic, non-slip stool. Keep the floor dry. Clean up any water that spills on the floor as soon as it happens. Remove soap buildup in the tub or shower regularly. Attach bath mats securely with double-sided non-slip rug tape. Do not have throw rugs and other things on the floor that can make you trip. What can I do in the bedroom? Use night lights. Make sure that you have a light by your bed that is easy to reach. Do not use any sheets or blankets that are too big for your bed. They should not hang down onto the floor. Have a firm chair that has side arms. You can use this for support while you get dressed. Do not have throw rugs and other things on the floor that can make you trip. What can I do in the kitchen? Clean up any spills right away. Avoid walking on wet floors. Keep items that you use a lot in easy-to-reach places. If you need to reach something above you, use a strong step stool that has a grab bar. Keep electrical cords out of  the way. Do not use floor polish or wax that makes floors slippery. If you must use wax, use non-skid floor wax. Do not have throw rugs and other things on the floor that can make you trip. What can I do with my stairs? Do not leave any items on the stairs. Make sure that there are handrails on both sides of the stairs and use them. Fix handrails that are broken or loose. Make sure that handrails are as long as the stairways. Check any carpeting to make sure that it is firmly attached to the stairs. Fix any carpet that is loose or worn. Avoid having throw rugs at the top or bottom of the stairs. If you do have throw rugs, attach them to the floor with carpet tape. Make sure that you have a light switch at the top of the stairs and the bottom of the stairs. If you do not have them, ask someone to add them for you. What else can I do to help prevent falls? Wear  shoes that: Do not have high heels. Have rubber bottoms. Are comfortable and fit you well. Are closed at the toe. Do not wear sandals. If you use a stepladder: Make sure that it is fully opened. Do not climb a closed stepladder. Make sure that both sides of the stepladder are locked into place. Ask someone to hold it for you, if possible. Clearly mark and make sure that you can see: Any grab bars or handrails. First and last steps. Where the edge of each step is. Use tools that help you move around (mobility aids) if they are needed. These include: Canes. Walkers. Scooters. Crutches. Turn on the lights when you go into a dark area. Replace any light bulbs as soon as they burn out. Set up your furniture so you have a clear path. Avoid moving your furniture around. If any of your floors are uneven, fix them. If there are any pets around you, be aware of where they are. Review your medicines with your doctor. Some medicines can make you feel dizzy. This can increase your chance of falling. Ask your doctor what other things that you can do to help prevent falls. This information is not intended to replace advice given to you by your health care provider. Make sure you discuss any questions you have with your health care provider. Document Released: 12/19/2008 Document Revised: 07/31/2015 Document Reviewed: 03/29/2014 Elsevier Interactive Patient Education  2017 [employer]. 2

## 2022-07-20 NOTE — Addendum Note (Signed)
Addended by: Saralyn Pilar on: 07/20/2022 03:39 PM   Modules accepted: Orders

## 2022-09-06 ENCOUNTER — Telehealth: Payer: Self-pay | Admitting: Nurse Practitioner

## 2022-09-06 NOTE — Progress Notes (Signed)
Check free T4 with next set of labs.  Hemoglobin A1c up slightly at 6.1.  Monitor closely.

## 2022-11-28 ENCOUNTER — Other Ambulatory Visit: Payer: Self-pay | Admitting: Interventional Cardiology

## 2022-12-26 ENCOUNTER — Other Ambulatory Visit: Payer: Self-pay | Admitting: Interventional Cardiology

## 2022-12-30 ENCOUNTER — Other Ambulatory Visit: Payer: Self-pay | Admitting: Physician Assistant

## 2023-01-04 ENCOUNTER — Other Ambulatory Visit: Payer: Self-pay

## 2023-01-04 DIAGNOSIS — Z6825 Body mass index (BMI) 25.0-25.9, adult: Secondary | ICD-10-CM

## 2023-01-04 DIAGNOSIS — I10 Essential (primary) hypertension: Secondary | ICD-10-CM

## 2023-01-04 DIAGNOSIS — E039 Hypothyroidism, unspecified: Secondary | ICD-10-CM

## 2023-01-12 ENCOUNTER — Other Ambulatory Visit: Payer: Medicare Other

## 2023-01-12 DIAGNOSIS — I10 Essential (primary) hypertension: Secondary | ICD-10-CM | POA: Diagnosis not present

## 2023-01-12 DIAGNOSIS — R7301 Impaired fasting glucose: Secondary | ICD-10-CM | POA: Diagnosis not present

## 2023-01-12 DIAGNOSIS — Z6825 Body mass index (BMI) 25.0-25.9, adult: Secondary | ICD-10-CM

## 2023-01-12 DIAGNOSIS — E039 Hypothyroidism, unspecified: Secondary | ICD-10-CM

## 2023-01-13 LAB — CBC WITH DIFFERENTIAL/PLATELET
Basophils Absolute: 0 10*3/uL (ref 0.0–0.2)
Basos: 1 %
EOS (ABSOLUTE): 0.1 10*3/uL (ref 0.0–0.4)
Eos: 2 %
Hematocrit: 43.7 % (ref 37.5–51.0)
Hemoglobin: 14.1 g/dL (ref 13.0–17.7)
Immature Grans (Abs): 0 10*3/uL (ref 0.0–0.1)
Immature Granulocytes: 0 %
Lymphocytes Absolute: 0.8 10*3/uL (ref 0.7–3.1)
Lymphs: 17 %
MCH: 28 pg (ref 26.6–33.0)
MCHC: 32.3 g/dL (ref 31.5–35.7)
MCV: 87 fL (ref 79–97)
Monocytes Absolute: 0.3 10*3/uL (ref 0.1–0.9)
Monocytes: 7 %
Neutrophils Absolute: 3.5 10*3/uL (ref 1.4–7.0)
Neutrophils: 73 %
Platelets: 228 10*3/uL (ref 150–450)
RBC: 5.03 x10E6/uL (ref 4.14–5.80)
RDW: 13.1 % (ref 11.6–15.4)
WBC: 4.8 10*3/uL (ref 3.4–10.8)

## 2023-01-13 LAB — COMPREHENSIVE METABOLIC PANEL
ALT: 9 [IU]/L (ref 0–44)
AST: 16 [IU]/L (ref 0–40)
Albumin: 4.4 g/dL (ref 3.9–4.9)
Alkaline Phosphatase: 85 [IU]/L (ref 44–121)
BUN/Creatinine Ratio: 12 (ref 10–24)
BUN: 13 mg/dL (ref 8–27)
Bilirubin Total: 0.5 mg/dL (ref 0.0–1.2)
CO2: 24 mmol/L (ref 20–29)
Calcium: 9.7 mg/dL (ref 8.6–10.2)
Chloride: 103 mmol/L (ref 96–106)
Creatinine, Ser: 1.07 mg/dL (ref 0.76–1.27)
Globulin, Total: 2.6 g/dL (ref 1.5–4.5)
Glucose: 98 mg/dL (ref 70–99)
Potassium: 4.5 mmol/L (ref 3.5–5.2)
Sodium: 140 mmol/L (ref 134–144)
Total Protein: 7 g/dL (ref 6.0–8.5)
eGFR: 76 mL/min/{1.73_m2} (ref >59)

## 2023-01-13 LAB — TSH: TSH: 6.47 u[IU]/mL — ABNORMAL HIGH (ref 0.450–4.500)

## 2023-01-13 LAB — LIPID PANEL
Chol/HDL Ratio: 2.8 ratio (ref 0.0–5.0)
Cholesterol, Total: 177 mg/dL (ref 100–199)
HDL: 63 mg/dL (ref >39)
LDL Chol Calc (NIH): 103 mg/dL — ABNORMAL HIGH (ref 0–99)
Triglycerides: 56 mg/dL (ref 0–149)
VLDL Cholesterol Cal: 11 mg/dL (ref 5–40)

## 2023-01-13 LAB — HEMOGLOBIN A1C
Est. average glucose Bld gHb Est-mCnc: 120 mg/dL
Hgb A1c MFr Bld: 5.8 % — ABNORMAL HIGH (ref 4.8–5.6)

## 2023-01-18 ENCOUNTER — Ambulatory Visit (INDEPENDENT_AMBULATORY_CARE_PROVIDER_SITE_OTHER): Payer: Medicare Other | Admitting: Family Medicine

## 2023-01-18 ENCOUNTER — Encounter: Payer: Self-pay | Admitting: Family Medicine

## 2023-01-18 VITALS — BP 125/72 | HR 60 | Ht 70.0 in | Wt 174.4 lb

## 2023-01-18 DIAGNOSIS — I2583 Coronary atherosclerosis due to lipid rich plaque: Secondary | ICD-10-CM | POA: Diagnosis not present

## 2023-01-18 DIAGNOSIS — R7303 Prediabetes: Secondary | ICD-10-CM | POA: Diagnosis not present

## 2023-01-18 DIAGNOSIS — E782 Mixed hyperlipidemia: Secondary | ICD-10-CM | POA: Diagnosis not present

## 2023-01-18 DIAGNOSIS — I2510 Atherosclerotic heart disease of native coronary artery without angina pectoris: Secondary | ICD-10-CM

## 2023-01-18 DIAGNOSIS — I10 Essential (primary) hypertension: Secondary | ICD-10-CM

## 2023-01-18 DIAGNOSIS — E039 Hypothyroidism, unspecified: Secondary | ICD-10-CM | POA: Insufficient documentation

## 2023-01-18 MED ORDER — ROSUVASTATIN CALCIUM 5 MG PO TABS: 5.0000 mg | ORAL_TABLET | Freq: Every day | ORAL | 3 refills | Status: DC

## 2023-01-18 NOTE — Patient Instructions (Signed)
It was nice to see you today,  We addressed the following topics today: -I would like you to restart your Crestor at a lower dose of 5 mg.  This is to reduce your risk of heart attack and stroke.  I do not believe it is what is causing your fatigue either. - Your TSH level was a little elevated meaning you may have low levels of thyroid hormone.  This would be more likely what is causing your fatigue.  The treatment for this would be thyroid hormone replacement therapy.  If you would like this in the future let us know - I would like you to follow-up in 2 months for a lab visit to recheck your cholesterol level - If you change your mind about a Cologuard let us know and we can put the order in  Have a great day,  Frederic Jericho, MD

## 2023-01-18 NOTE — Assessment & Plan Note (Signed)
TSH continues to increase.  Patient complaining of fatigue even with adequate sleep.  Not interested in replacement therapy at this time.  The next time patient gets TSH we will add TPO antibody and T4.

## 2023-01-18 NOTE — Progress Notes (Signed)
Established Patient Office Visit  Subjective   Patient ID: Andres Day, male    DOB: 1955-08-04  Age: 67 y.o. MRN: [id number]  Chief Complaint  Patient presents with   Medical Management of Chronic Issues    HPI Patient has stopped taking his Crestor 20 mg about 3 weeks ago because he thought it was making him fatigued.  Also complained of feeling like he had a funny feeling in his kidneys when taking this.  We discussed his previous recommendation from the cardiologist about an LDL goal of less than 70.  Never prescribed a dose lower than 20mg .  Patient agreeable to restarting Crestor at 5 mg.  Patient complaining of fatigue.  Goes to bed early because there is nothing going on to watch at TV and wake up at 8 AM.  Generally gets 10 hours of sleep.  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  He also generally takes an hour nap after lunch due to fatigue.  He has never been told that he snores or has apneic episodes.  We discussed his TSH level being elevated.  Patient declines treatment with thyroid hormone at this time.  Prediabetes-discussed his A1c of 5.8 and being stable from the past year.  Patient has declined colonoscopy as well as Cologuard.  Patient states he has friends with colon cancer and understands the risks of this.  Patient has allergies that tend to last year around now that he has moved back to [place].  He takes Claritin and Flonase.  Claritin helps but makes him a little drowsy.  Discussed decreasing the dose.  Patient has had 3-4 episodes of tooth pain on the left side.  Sees generally last a few days or more.  It is a sharp stabbing pain that is painful to touch.  Has been to the dentist because he thought it was a toothache but was told there was nothing wrong with his teeth.  The 10-year ASCVD risk score (Arnett DK, et al., 2019) is: 14.7%  Health Maintenance Due  Topic Date Due   Hepatitis C Screening  Never done   DTaP/Tdap/Td (1 - Tdap) Never  done   Zoster Vaccines- Shingrix (1 of 2) Never done   Colonoscopy  Never done   Pneumonia Vaccine 17+ Years old (1 of 1 - PCV) Never done      Objective:     BP 125/72   Pulse 60   Ht 5\' 10"  (1.778 m)   Wt 174 lb 6.4 oz (79.1 kg)   SpO2 100%   BMI 25.02 kg/m    Physical Exam General: Alert, oriented HEENT: PERRLA, EOMI.  Arcus senilis.  Moist oral mucosa.  Partial dentures uppers. CV: Regular rhythm Pulmonary: Lungs clear bilaterally GI: Soft, nontender.  Normal bowel sounds   No results found for any visits on 01/18/23.      Assessment & Plan:   Essential hypertension Assessment & Plan: Blood pressure at goal today.  Continue lisinopril 5 mg.   Coronary artery disease due to lipid rich plaque Assessment & Plan: Patient stopped taking his Crestor 3 weeks ago due to believing it was causing his fatigue.  Fatigue has not improved since stopping it.  Patient hesitant but ultimately agreeable to starting Crestor back at a lower dose of 5 mg to help meet his goal of LDL less than 70.  Patient states his cardiologist moved.  He does not want referral to a new cardiologist.   Hypothyroidism, unspecified type  Assessment & Plan: TSH continues to increase.  Patient complaining of fatigue even with adequate sleep.  Not interested in replacement therapy at this time.  The next time patient gets TSH we will add TPO antibody and T4.  Orders: -     TSH + free T4; Future -     Anti-TPO Ab (RDL); Future  Prediabetes Assessment & Plan: Stable.  Encourage limiting carbohydrate intake.  Patient at healthy weight already.   Mixed hyperlipidemia -     Lipid panel; Future  Other orders -     Rosuvastatin Calcium; Take 1 tablet (5 mg total) by mouth daily.  Dispense: 90 tablet; Refill: 3     Return in about 1 year (around 01/18/2024) for physical.    Sandre Kitty, MD

## 2023-01-18 NOTE — Assessment & Plan Note (Signed)
Blood pressure at goal today.  Continue lisinopril 5 mg.

## 2023-01-18 NOTE — Assessment & Plan Note (Signed)
Patient stopped taking his Crestor 3 weeks ago due to believing it was causing his fatigue.  Fatigue has not improved since stopping it.  Patient hesitant but ultimately agreeable to starting Crestor back at a lower dose of 5 mg to help meet his goal of LDL less than 70.  Patient states his cardiologist moved.  He does not want referral to a new cardiologist.

## 2023-01-18 NOTE — Assessment & Plan Note (Signed)
Stable.  Encourage limiting carbohydrate intake.  Patient at healthy weight already.

## 2023-01-30 IMAGING — US US CAROTID DUPLEX BILAT
1 series · 13 of 24 positions shown · non-contrast
Comparison: None.

CLINICAL DATA: 65-year-old male with a history of carotid artery
stenosis

EXAM:
BILATERAL CAROTID DUPLEX ULTRASOUND
TECHNIQUE: Gray scale imaging, color Doppler and duplex ultrasound were
performed of bilateral carotid and vertebral arteries in the neck.

[Series 1: us carotid duplex bilat · 0.06mm/px · 13 of 53 slices shown]
[im 1/53]
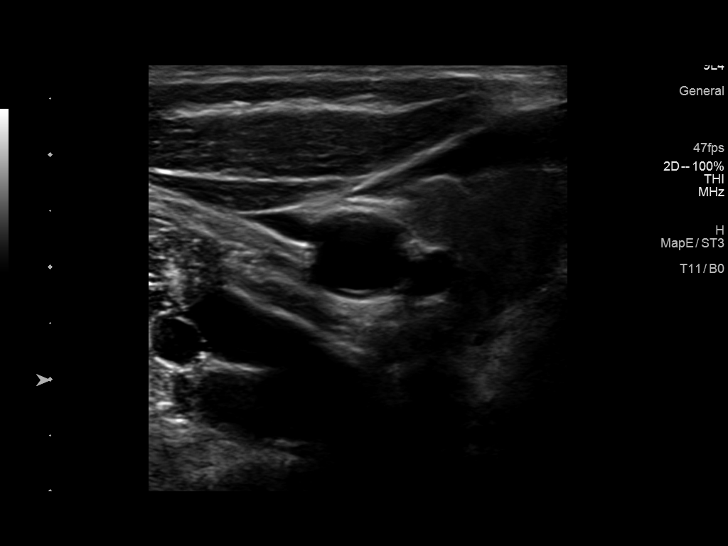
[im 5/53]
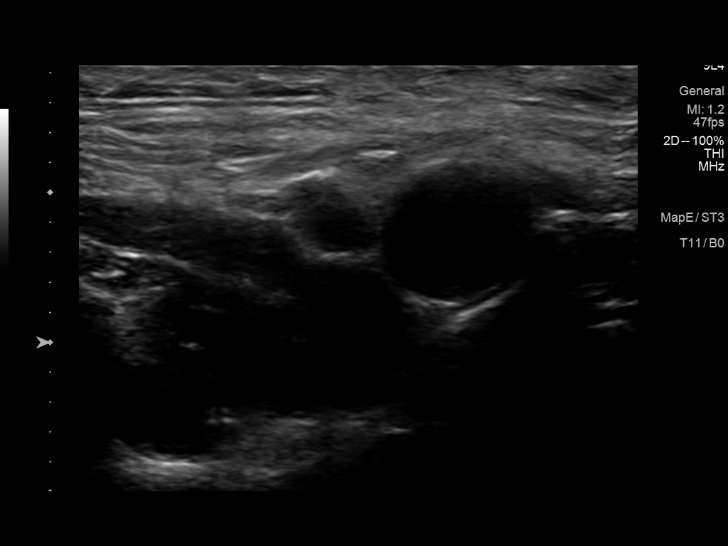
[im 10/53]
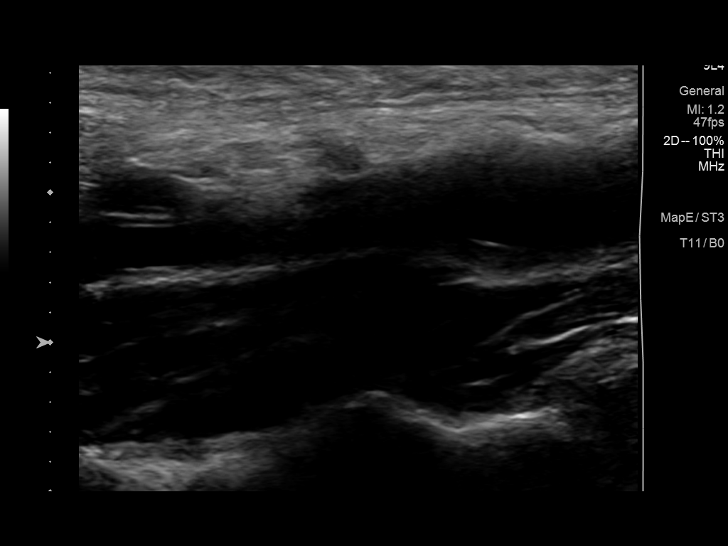
[im 14/53]
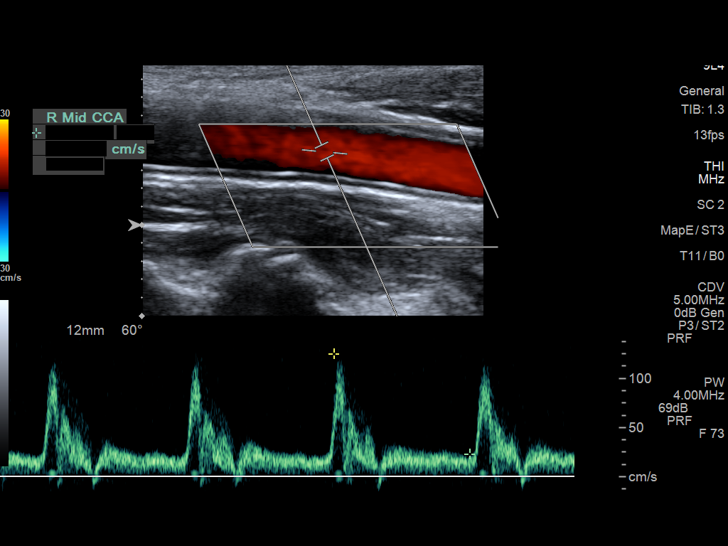
[im 19/53]
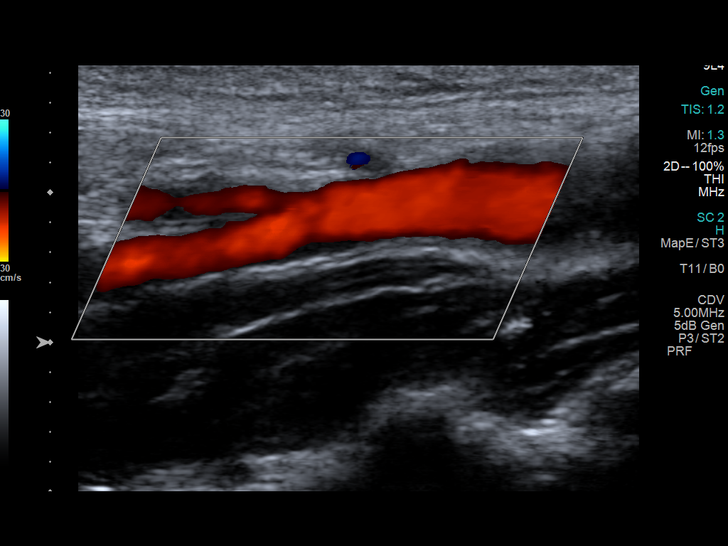
[im 23/53]
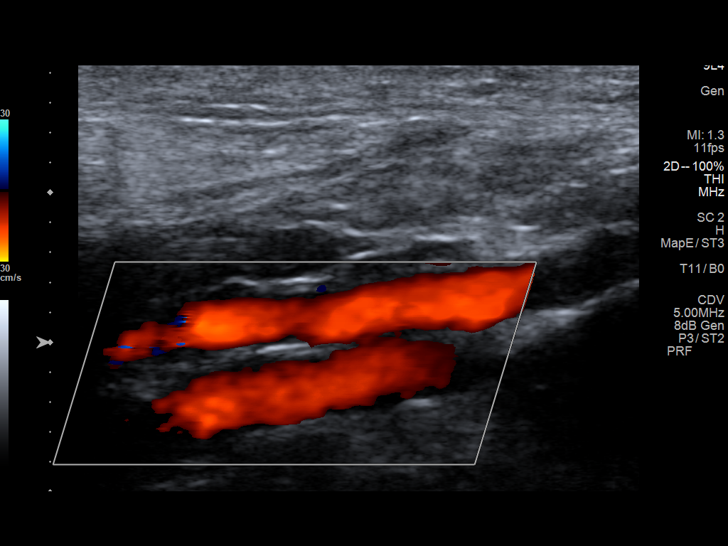
[im 28/53]
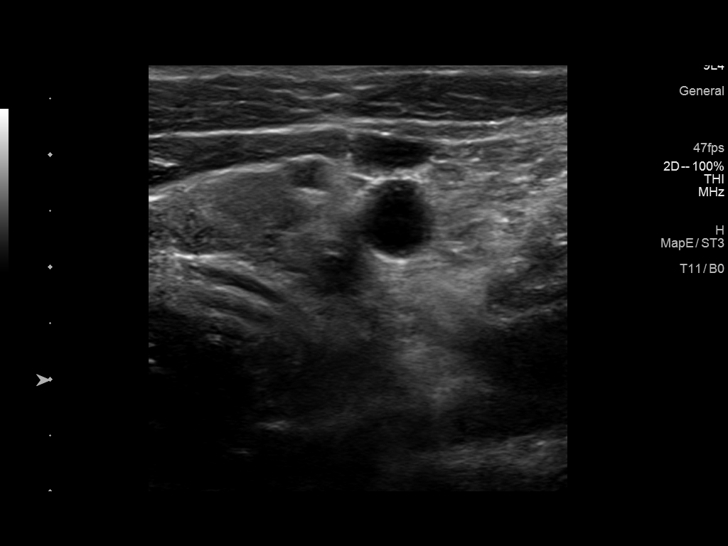
[im 30/53]
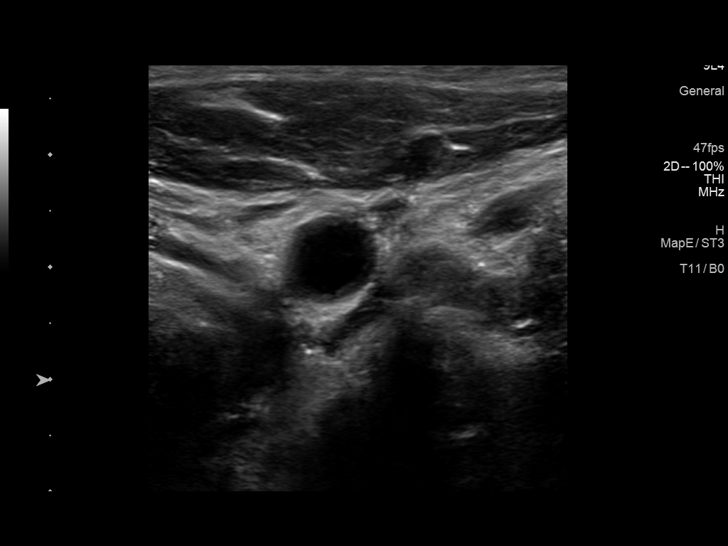
[im 34/53]
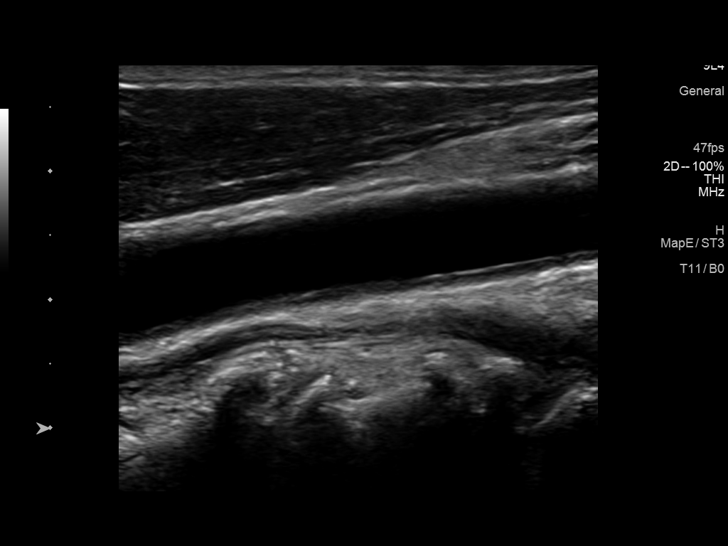
[im 39/53]
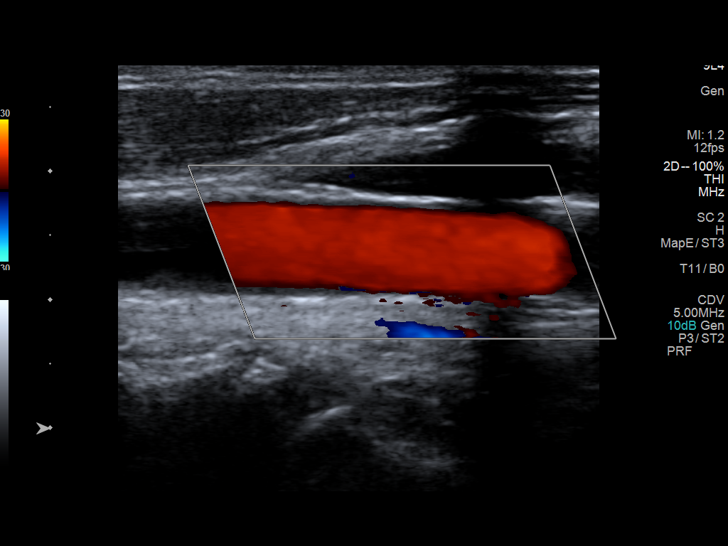
[im 43/53]
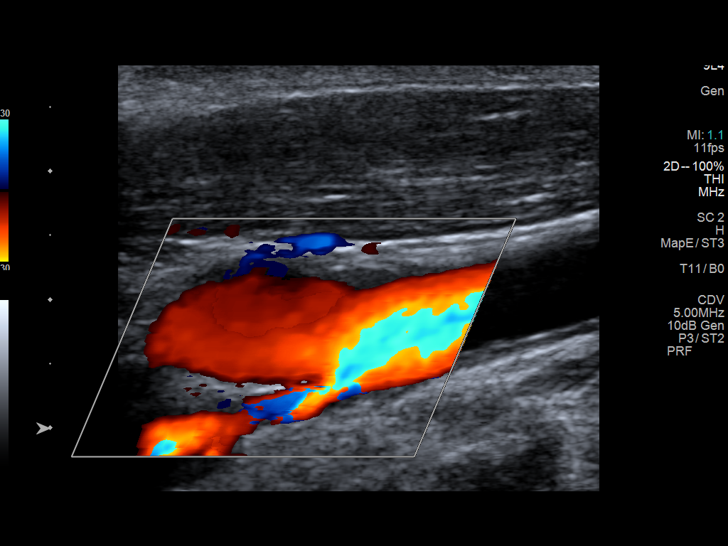
[im 48/53]
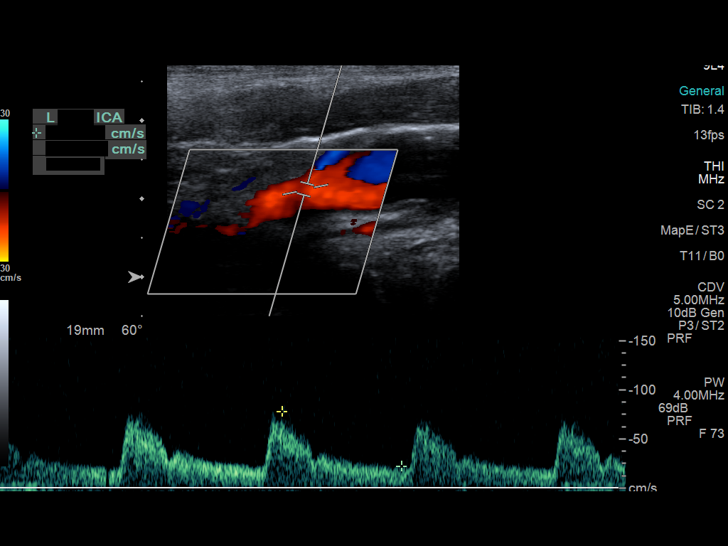
[im 53/53]
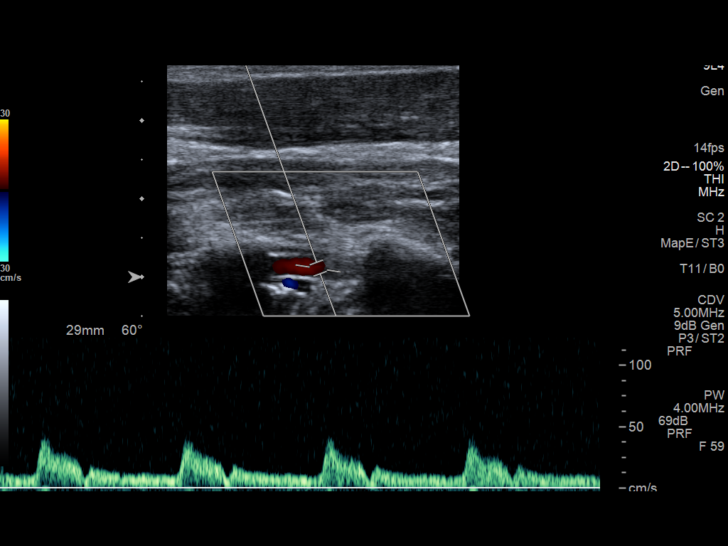

[13 of 24 positions shown; findings below may reference images not displayed]

FINDINGS: Criteria: Quantification of carotid stenosis is based on velocity
parameters that correlate the residual internal carotid diameter
with NASCET-based stenosis levels, using the diameter of the distal
internal carotid lumen as the denominator for stenosis measurement.

The following velocity measurements were obtained:

RIGHT

ICA:  Systolic 101 cm/sec, Diastolic 29 cm/sec

CCA:  125 cm/sec

SYSTOLIC ICA/CCA RATIO:

ECA:  110 cm/sec

LEFT

ICA:  Systolic 99 cm/sec, Diastolic 24 cm/sec

CCA:  141 cm/sec

SYSTOLIC ICA/CCA RATIO:

ECA:  88 cm/sec

Right Brachial SBP: 155

Left Brachial SBP: 136

RIGHT CAROTID ARTERY: No significant calcifications of the right
common carotid artery. Intermediate waveform maintained.
Heterogeneous and partially calcified plaque at the right carotid
bifurcation. No significant lumen shadowing. Low resistance waveform
of the right ICA. No significant tortuosity.

RIGHT VERTEBRAL ARTERY: Antegrade flow with low resistance waveform.

LEFT CAROTID ARTERY: No significant calcifications of the left
common carotid artery. Intermediate waveform maintained.
Heterogeneous and partially calcified plaque at the left carotid
bifurcation without significant lumen shadowing. Low resistance
waveform of the left ICA. No significant tortuosity.

LEFT VERTEBRAL ARTERY:  Antegrade flow with low resistance waveform.
IMPRESSION: Color duplex indicates minimal heterogeneous and calcified plaque,
with no hemodynamically significant stenosis by duplex criteria in
the extracranial cerebrovascular circulation.

## 2023-02-14 NOTE — Telephone Encounter (Signed)
Forwarded medication refill information.

## 2023-03-21 ENCOUNTER — Other Ambulatory Visit: Payer: Medicare Other

## 2023-03-21 DIAGNOSIS — E039 Hypothyroidism, unspecified: Secondary | ICD-10-CM | POA: Diagnosis not present

## 2023-03-21 DIAGNOSIS — Z1159 Encounter for screening for other viral diseases: Secondary | ICD-10-CM | POA: Diagnosis not present

## 2023-03-21 DIAGNOSIS — E782 Mixed hyperlipidemia: Secondary | ICD-10-CM | POA: Diagnosis not present

## 2023-04-02 LAB — LIPID PANEL
Chol/HDL Ratio: 2.6 {ratio} (ref 0.0–5.0)
Cholesterol, Total: 151 mg/dL (ref 100–199)
HDL: 57 mg/dL (ref >39)
LDL Chol Calc (NIH): 84 mg/dL (ref 0–99)
Triglycerides: 46 mg/dL (ref 0–149)
VLDL Cholesterol Cal: 10 mg/dL (ref 5–40)

## 2023-04-02 LAB — ANTI-TPO AB (RDL): Anti-TPO Ab (RDL): 706.9 [IU]/mL — ABNORMAL HIGH (ref <9.0)

## 2023-04-02 LAB — TSH+FREE T4
Free T4: 0.96 ng/dL (ref 0.82–1.77)
TSH: 5.42 u[IU]/mL — ABNORMAL HIGH (ref 0.450–4.500)

## 2023-04-02 LAB — HEPATITIS C ANTIBODY: Hep C Virus Ab: NONREACTIVE

## 2023-04-04 ENCOUNTER — Encounter: Payer: Self-pay | Admitting: Family Medicine

## 2023-04-16 ENCOUNTER — Other Ambulatory Visit: Payer: Self-pay | Admitting: Nurse Practitioner

## 2023-04-16 DIAGNOSIS — I10 Essential (primary) hypertension: Secondary | ICD-10-CM

## 2023-04-18 MED ORDER — LISINOPRIL 5 MG PO TABS: 5.0000 mg | ORAL_TABLET | Freq: Every day | ORAL | 3 refills | Status: DC

## 2023-12-26 NOTE — Progress Notes (Signed)
 Andres Day                                          MRN: [id number]   12/26/2023   The [facility] Quality Team Specialist reviewed this patient medical record for the purposes of chart review for care gap closure. The following were reviewed: chart review for care gap closure-colorectal cancer screening.    [facility] Quality Team

## 2023-12-31 ENCOUNTER — Other Ambulatory Visit: Payer: Self-pay | Admitting: Family Medicine

## 2024-01-18 ENCOUNTER — Encounter: Payer: Self-pay | Admitting: Family Medicine

## 2024-01-18 ENCOUNTER — Ambulatory Visit: Payer: Medicare Other | Admitting: Family Medicine

## 2024-01-18 VITALS — BP 125/68 | HR 60 | Ht 70.0 in | Wt 167.0 lb

## 2024-01-18 DIAGNOSIS — Z125 Encounter for screening for malignant neoplasm of prostate: Secondary | ICD-10-CM | POA: Diagnosis not present

## 2024-01-18 DIAGNOSIS — J029 Acute pharyngitis, unspecified: Secondary | ICD-10-CM | POA: Diagnosis not present

## 2024-01-18 DIAGNOSIS — I10 Essential (primary) hypertension: Secondary | ICD-10-CM

## 2024-01-18 DIAGNOSIS — E039 Hypothyroidism, unspecified: Secondary | ICD-10-CM | POA: Diagnosis not present

## 2024-01-18 DIAGNOSIS — I2510 Atherosclerotic heart disease of native coronary artery without angina pectoris: Secondary | ICD-10-CM

## 2024-01-18 DIAGNOSIS — I2583 Coronary atherosclerosis due to lipid rich plaque: Secondary | ICD-10-CM | POA: Diagnosis not present

## 2024-01-18 DIAGNOSIS — Z0000 Encounter for general adult medical examination without abnormal findings: Secondary | ICD-10-CM

## 2024-01-18 DIAGNOSIS — R7303 Prediabetes: Secondary | ICD-10-CM | POA: Diagnosis not present

## 2024-01-18 DIAGNOSIS — E782 Mixed hyperlipidemia: Secondary | ICD-10-CM | POA: Diagnosis not present

## 2024-01-18 NOTE — Assessment & Plan Note (Signed)
-   Due for tetanus, pneumococcal, and shingles vaccines. Received flu and COVID vaccines at [facility]. Declines colonoscopy. Will be 68 years old in three weeks. - Plan for labs: lipid panel, TSH, CMP, CBC, and a final PSA. - Advised to get tetanus and shingles vaccines at a pharmacy. - Can administer pneumococcal vaccine in office.

## 2024-01-18 NOTE — Assessment & Plan Note (Signed)
-   On rosuvastatin  5mg  daily after dose reduction from 20mg . Denies myalgias. Cardiologist's goal is LDL < 70. - Check lipid panel. If LDL is not at goal, will consider increasing rosuvastatin  to 10mg  daily.

## 2024-01-18 NOTE — Assessment & Plan Note (Signed)
-   Reports visualizing gray objects in his throat and has intermittent sore throat. History of heavy smoking. Exam of oropharynx is unremarkable. Tonsils appear normal, no lesions seen. - Will monitor. Advised to follow up if symptoms worsen, or if he develops frequent sore throats, dysphagia, or odynophagia for ENT referral.

## 2024-01-18 NOTE — Assessment & Plan Note (Signed)
-   Reports dizziness and low BP readings at home, which resolved after reducing lisinopril  dose by half. - Continue lisinopril  at one-half tablet daily.

## 2024-01-18 NOTE — Patient Instructions (Signed)
 It was nice to see you today,  We addressed the following topics today: - You can continue taking half a tablet of your blood pressure medicine (lisinopril ). - I will let you know if we need to change your cholesterol medicine (Crestor ) based on your lab results. - Please go to a pharmacy like [facility] to get your tetanus and shingles shots. - We will check your bloodwork today, including labs for cholesterol, thyroid , blood sugar, and a final prostate cancer screen. - If your throat continues to bother you, gets worse, or you have trouble or pain with swallowing, let me know, and I will refer you to an ear, nose, and throat specialist.  Have a great day,  Rolan Slain, MD

## 2024-01-18 NOTE — Progress Notes (Signed)
 Annual physical  Subjective   Patient ID: Andres Day, male    DOB: 1955/08/05  Age: 68 y.o. MRN: [id number]  No chief complaint on file.  HPI Andres Day is a 68 y.o. old male here  for annual exam.   Subjective - Reports seeing two gray objects in his throat using a borescope. - Intermittent sore throat. Denies difficulty swallowing. Using Flonase  for sinus issues. - Reports dizziness. BP at home was 94/57 on 11/16/2023 and 97/59 on 11/18/2023. After halving lisinopril  dose, BP improved to 114/66 on 11/25/2023 and dizziness resolved. Reports occasional orthostatic lightheadedness.  Medications Lisinopril , self-reduced dose to one-half tablet daily. Rosuvastatin  (Crestor ) 5 mg daily. Flonase  nasal spray prn.  PMH, PSH, FH, Social Hx PMHx: Hypertension, hyperlipidemia, history of subclinical hypothyroidism. PSH: Partial denture. FH: Not discussed. Social Hx: Former smoker, quit in 1999, smoked 2 packs/day for years. Denies use of chewing tobacco. 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  ROS HEENT: Reports intermittent sore throat, sinus drainage. Denies dysphagia. Constitutional: Denies significant muscle aches on rosuvastatin . Reports prior dizziness, now resolved. Cardiovascular: Reports occasional orthostatic dizziness.   The 10-year ASCVD risk score (Arnett DK, et al., 2019) is: 15.3%  Health Maintenance Due  Topic Date Due   DTaP/Tdap/Td (1 - Tdap) Never done   Medicare Annual Wellness (AWV)  07/20/2023   COVID-19 Vaccine (7 - 2025-26 season) 11/07/2023      Objective:     BP 125/68   Pulse 60   Ht 5' 10 (1.778 m)   Wt 167 lb (75.8 kg)   SpO2 99%   BMI 23.96 kg/m    Physical Exam Gen: alert, oriented HEENT: Oropharynx is clear. Tonsils are non-enlarged, no exudates, lesions, or tonsillar stones noted. Scar tissue noted on palate, reportedly from a fall. EOMI. CV: rrr, no murmur Pulm: lctab. No wheeze or crackles.  GI: soft,  nbs.  Nontender to palpation MSK: strength equal b/l. Normal gait Ext: no pedal edema Skin: warm and dry, no rashes Psych: pleasant affect.  Spontaneous speech   No results found for any visits on 01/18/24.      Assessment & Plan:   Physical exam, annual  Prostate cancer screening -     PSA  Hypothyroidism, unspecified type -     TSH  Prediabetes -     Hemoglobin A1c  Essential hypertension Assessment & Plan: - Reports dizziness and low BP readings at home, which resolved after reducing lisinopril  dose by half. - Continue lisinopril  at one-half tablet daily.  Orders: -     Comprehensive metabolic panel with GFR  Coronary artery disease due to lipid rich plaque Assessment & Plan: - On rosuvastatin  5mg  daily after dose reduction from 20mg . Denies myalgias. Cardiologist's goal is LDL < 70. - Check lipid panel. If LDL is not at goal, will consider increasing rosuvastatin  to 10mg  daily.  Orders: -     Lipid panel  Healthcare maintenance Assessment & Plan: - Due for tetanus, pneumococcal, and shingles vaccines. [facility]. Declines colonoscopy. Will be 68 years old in three weeks. - Plan for labs: lipid panel, TSH, CMP, CBC, and a final PSA. - Advised to get tetanus and shingles vaccines at a pharmacy. - Can administer pneumococcal vaccine in office.   Sore throat Assessment & Plan: - Reports visualizing gray objects in his throat and has intermittent sore throat. History of heavy smoking. Exam of oropharynx is unremarkable. Tonsils appear normal, no lesions seen. -  Will monitor. Advised to follow up if symptoms worsen, or if he develops frequent sore throats, dysphagia, or odynophagia for ENT referral.      Return in about 1 year (around 01/17/2025) for physical.    Andres MARLA Slain, MD

## 2024-01-19 LAB — COMPREHENSIVE METABOLIC PANEL WITH GFR
ALT: 12 IU/L (ref 0–44)
AST: 18 IU/L (ref 0–40)
Albumin: 4.4 g/dL (ref 3.9–4.9)
Alkaline Phosphatase: 86 IU/L (ref 47–123)
BUN/Creatinine Ratio: 10 (ref 10–24)
BUN: 11 mg/dL (ref 8–27)
Bilirubin Total: 0.5 mg/dL (ref 0.0–1.2)
CO2: 24 mmol/L (ref 20–29)
Calcium: 9.6 mg/dL (ref 8.6–10.2)
Chloride: 102 mmol/L (ref 96–106)
Creatinine, Ser: 1.09 mg/dL (ref 0.76–1.27)
Globulin, Total: 2.5 g/dL (ref 1.5–4.5)
Glucose: 93 mg/dL (ref 70–99)
Potassium: 4.4 mmol/L (ref 3.5–5.2)
Sodium: 139 mmol/L (ref 134–144)
Total Protein: 6.9 g/dL (ref 6.0–8.5)
eGFR: 73 mL/min/1.73 (ref >59)

## 2024-01-19 LAB — LIPID PANEL
Chol/HDL Ratio: 2.5 ratio (ref 0.0–5.0)
Cholesterol, Total: 144 mg/dL (ref 100–199)
HDL: 57 mg/dL (ref >39)
LDL Chol Calc (NIH): 75 mg/dL (ref 0–99)
Triglycerides: 56 mg/dL (ref 0–149)
VLDL Cholesterol Cal: 12 mg/dL (ref 5–40)

## 2024-01-19 LAB — PSA: Prostate Specific Ag, Serum: 4.8 ng/mL — ABNORMAL HIGH (ref 0.0–4.0)

## 2024-01-19 LAB — HEMOGLOBIN A1C
Est. average glucose Bld gHb Est-mCnc: 123 mg/dL
Hgb A1c MFr Bld: 5.9 % — ABNORMAL HIGH (ref 4.8–5.6)

## 2024-01-19 LAB — TSH: TSH: 5.02 u[IU]/mL — ABNORMAL HIGH (ref 0.450–4.500)

## 2024-01-27 ENCOUNTER — Ambulatory Visit: Payer: Self-pay | Admitting: Family Medicine

## 2024-01-27 NOTE — Telephone Encounter (Signed)
 Pt was advised of the results and lab appt scheduled for 04/27/24.

## 2024-03-22 ENCOUNTER — Other Ambulatory Visit: Payer: Self-pay | Admitting: Family Medicine

## 2024-03-22 DIAGNOSIS — I10 Essential (primary) hypertension: Secondary | ICD-10-CM

## 2024-04-27 ENCOUNTER — Other Ambulatory Visit

## 2025-01-11 ENCOUNTER — Other Ambulatory Visit

## 2025-01-18 ENCOUNTER — Encounter: Admitting: Family Medicine
